# Patient Record
Sex: Female | Born: 1935 | Race: White | Hispanic: No | Smoking: Never smoker
Health system: Southern US, Community
[De-identification: ages and names within clinical notes are randomized; demographics above are authoritative.]

## PROBLEM LIST (undated history)

## (undated) DIAGNOSIS — M1990 Unspecified osteoarthritis, unspecified site: Secondary | ICD-10-CM

## (undated) DIAGNOSIS — M419 Scoliosis, unspecified: Secondary | ICD-10-CM

## (undated) DIAGNOSIS — D649 Anemia, unspecified: Secondary | ICD-10-CM

## (undated) DIAGNOSIS — J189 Pneumonia, unspecified organism: Secondary | ICD-10-CM

## (undated) DIAGNOSIS — H3530 Unspecified macular degeneration: Secondary | ICD-10-CM

## (undated) HISTORY — PX: CATARACT EXTRACTION W/ INTRAOCULAR LENS  IMPLANT, BILATERAL: SHX1307

## (undated) HISTORY — PX: EYE SURGERY: SHX253

---

## 2000-11-04 ENCOUNTER — Encounter: Payer: Self-pay | Admitting: Ophthalmology

## 2000-11-04 ENCOUNTER — Ambulatory Visit (HOSPITAL_COMMUNITY): Admission: RE | Admit: 2000-11-04 | Discharge: 2000-11-04 | Payer: Self-pay | Admitting: Ophthalmology

## 2004-07-05 ENCOUNTER — Ambulatory Visit (HOSPITAL_COMMUNITY): Admission: RE | Admit: 2004-07-05 | Discharge: 2004-07-06 | Payer: Self-pay | Admitting: Ophthalmology

## 2006-01-24 IMAGING — CR DG CHEST 2V
2 series · 2 of 2 positions shown · non-contrast
Comparison: There are no prior studies currently available for comparison.

CLINICAL DATA: Pre-retinal fibrosis.  Preoperative respiratory exam.
 CHEST ? 2 VIEW, 07/05/04:

[view not recorded (1 of 2)]
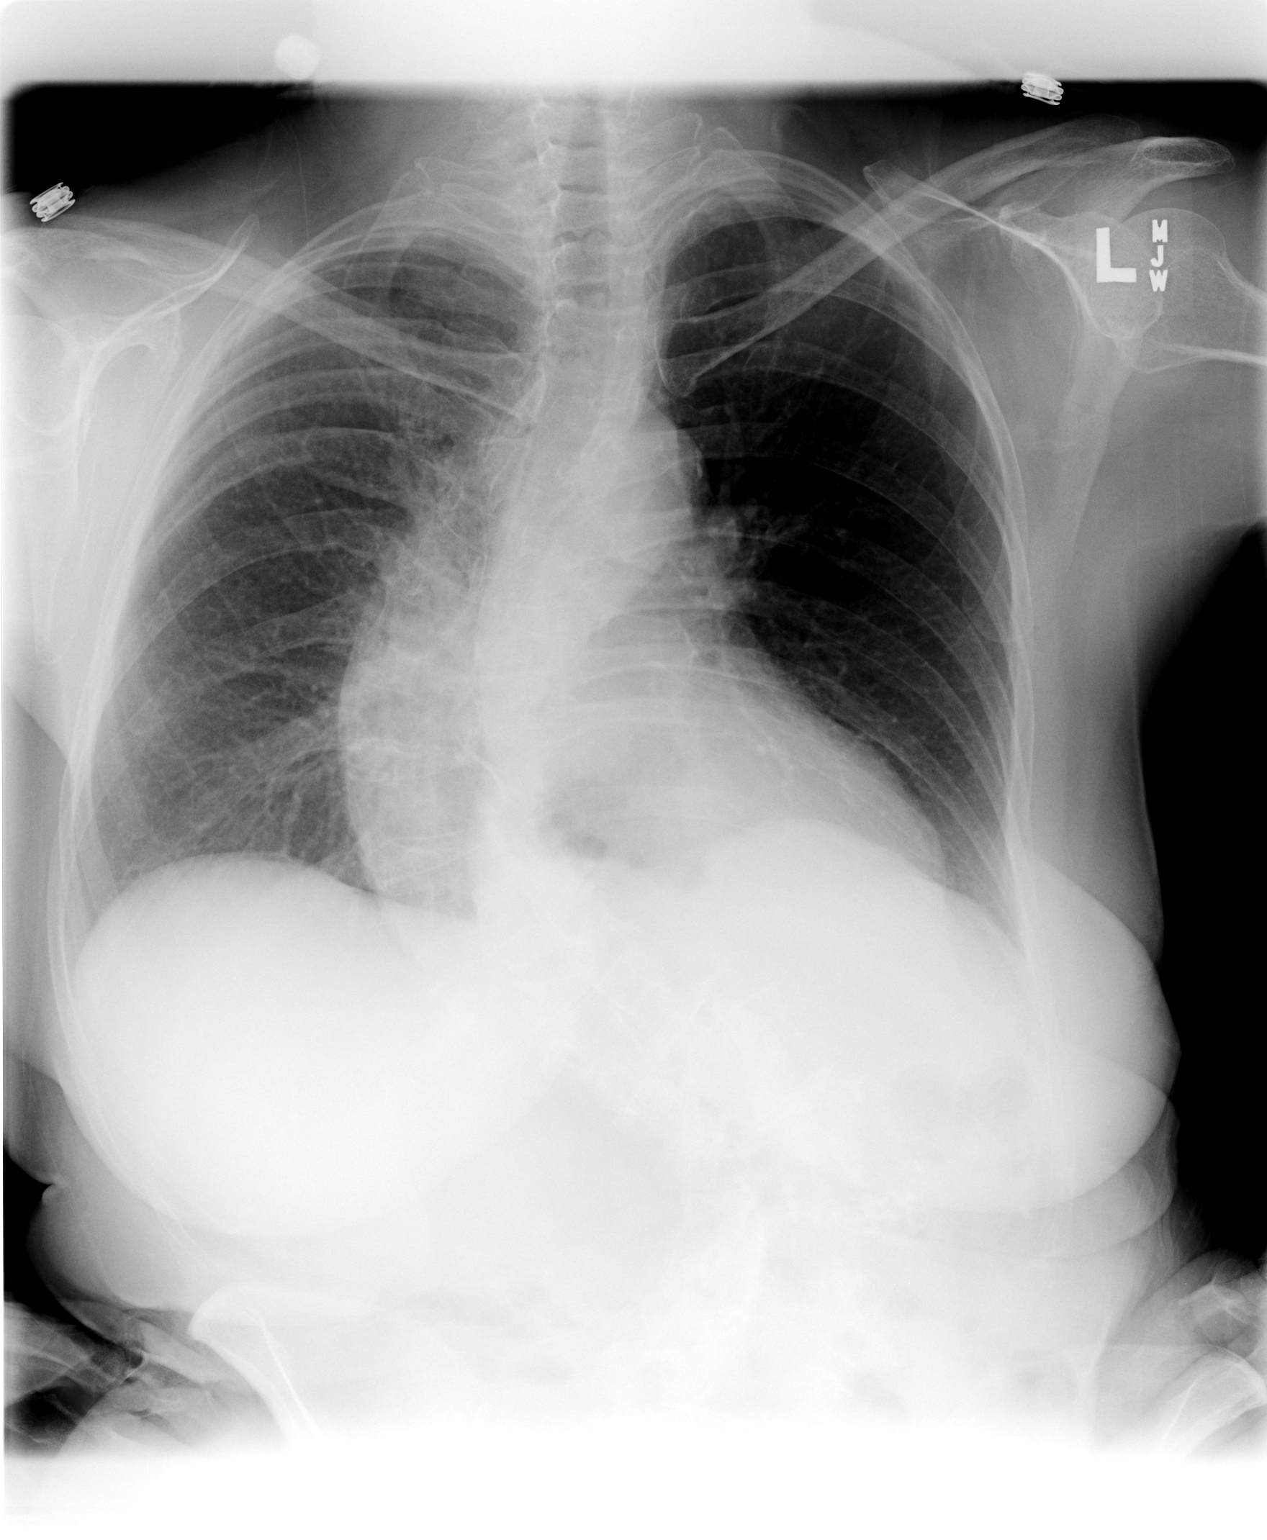

[view not recorded (2 of 2)]
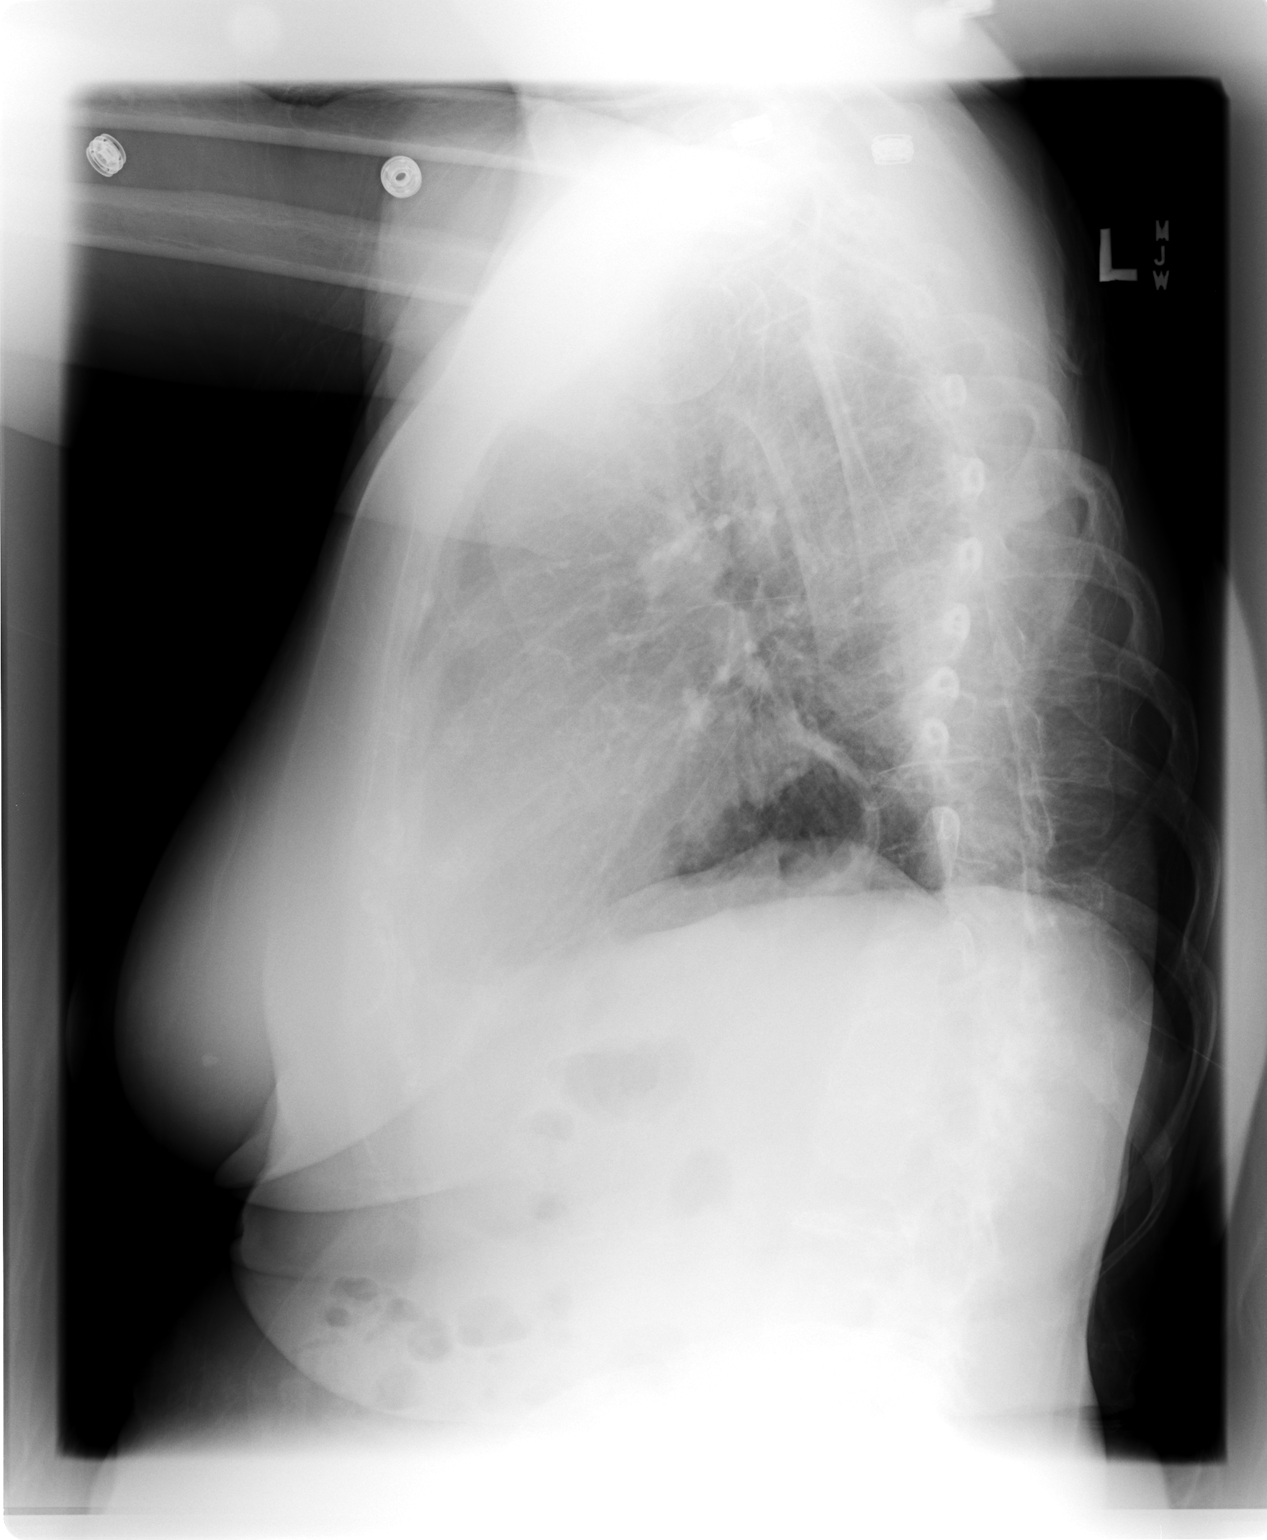

[2 of 2 positions shown; findings below may reference images not displayed]

There is marked thoracolumbar scoliosis present.  There is a moderate-sized hiatal hernia.  There are no infiltrative or edematous changes.  The heart is upper normal in size.
IMPRESSION: Marked scoliosis.  Hiatal hernia.  No evidence for active chest disease.

## 2011-01-07 ENCOUNTER — Ambulatory Visit (INDEPENDENT_AMBULATORY_CARE_PROVIDER_SITE_OTHER): Payer: Medicare Other | Admitting: Ophthalmology

## 2011-01-07 DIAGNOSIS — H3530 Unspecified macular degeneration: Secondary | ICD-10-CM

## 2011-01-07 DIAGNOSIS — H43819 Vitreous degeneration, unspecified eye: Secondary | ICD-10-CM

## 2011-01-07 DIAGNOSIS — H26499 Other secondary cataract, unspecified eye: Secondary | ICD-10-CM

## 2011-01-14 ENCOUNTER — Other Ambulatory Visit (INDEPENDENT_AMBULATORY_CARE_PROVIDER_SITE_OTHER): Payer: Medicare Other | Admitting: Ophthalmology

## 2011-01-14 DIAGNOSIS — H2700 Aphakia, unspecified eye: Secondary | ICD-10-CM

## 2011-07-17 ENCOUNTER — Ambulatory Visit (INDEPENDENT_AMBULATORY_CARE_PROVIDER_SITE_OTHER): Payer: Medicare Other | Admitting: Ophthalmology

## 2011-07-17 DIAGNOSIS — H43819 Vitreous degeneration, unspecified eye: Secondary | ICD-10-CM

## 2011-07-17 DIAGNOSIS — H35379 Puckering of macula, unspecified eye: Secondary | ICD-10-CM

## 2011-07-17 DIAGNOSIS — H3530 Unspecified macular degeneration: Secondary | ICD-10-CM

## 2012-01-20 ENCOUNTER — Ambulatory Visit (INDEPENDENT_AMBULATORY_CARE_PROVIDER_SITE_OTHER): Payer: Medicare Other | Admitting: Ophthalmology

## 2012-01-20 DIAGNOSIS — H43819 Vitreous degeneration, unspecified eye: Secondary | ICD-10-CM

## 2012-01-20 DIAGNOSIS — H35379 Puckering of macula, unspecified eye: Secondary | ICD-10-CM

## 2012-01-20 DIAGNOSIS — H3530 Unspecified macular degeneration: Secondary | ICD-10-CM

## 2012-07-22 ENCOUNTER — Ambulatory Visit (INDEPENDENT_AMBULATORY_CARE_PROVIDER_SITE_OTHER): Payer: Medicare Other | Admitting: Ophthalmology

## 2012-08-10 ENCOUNTER — Ambulatory Visit (INDEPENDENT_AMBULATORY_CARE_PROVIDER_SITE_OTHER): Payer: Medicare Other | Admitting: Ophthalmology

## 2012-08-10 DIAGNOSIS — H3530 Unspecified macular degeneration: Secondary | ICD-10-CM

## 2012-08-10 DIAGNOSIS — H43819 Vitreous degeneration, unspecified eye: Secondary | ICD-10-CM

## 2012-08-10 DIAGNOSIS — H35379 Puckering of macula, unspecified eye: Secondary | ICD-10-CM

## 2013-02-10 ENCOUNTER — Ambulatory Visit (INDEPENDENT_AMBULATORY_CARE_PROVIDER_SITE_OTHER): Payer: Medicare Other | Admitting: Ophthalmology

## 2013-02-10 DIAGNOSIS — H43819 Vitreous degeneration, unspecified eye: Secondary | ICD-10-CM

## 2013-02-10 DIAGNOSIS — H35379 Puckering of macula, unspecified eye: Secondary | ICD-10-CM

## 2013-02-10 DIAGNOSIS — H3530 Unspecified macular degeneration: Secondary | ICD-10-CM

## 2013-08-10 ENCOUNTER — Ambulatory Visit (INDEPENDENT_AMBULATORY_CARE_PROVIDER_SITE_OTHER): Payer: Medicare Other | Admitting: Ophthalmology

## 2013-08-10 DIAGNOSIS — H43819 Vitreous degeneration, unspecified eye: Secondary | ICD-10-CM

## 2013-08-10 DIAGNOSIS — H35379 Puckering of macula, unspecified eye: Secondary | ICD-10-CM

## 2013-08-10 DIAGNOSIS — H3530 Unspecified macular degeneration: Secondary | ICD-10-CM

## 2013-08-10 NOTE — H&P (Signed)
Emily Potter is an 80 y.o. female.   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  No past surgical history on file.  No family history on file. Social History:  has no tobacco, alcohol, and drug history on file.  Allergies: Allergies not on file  No prescriptions prior to admission    Review of systems otherwise negative  There were no vitals taken for this visit.  Physical exam: Mental status: oriented x3. Eyes: See eye exam associated with this date of surgery in media tab.  Scanned in by scanning center Ears, Nose, Throat: within normal limits Neck: Within Normal limits General: within normal limits Chest: Within normal limits Breast: deferred Heart: Within normal limits Abdomen: Within normal limits GU: deferred Extremities: within normal limits Skin: within normal limits  Assessment/Plan Preretinal fibrosis left eye Plan: To [facility] for Pars plana vitrectomy, membrane peel, gas injection, laser treatment left eye  Sherrie GeorgeMATTHEWS, JOHN D 08/10/2013, 3:37 PM

## 2013-08-23 ENCOUNTER — Encounter (HOSPITAL_COMMUNITY): Payer: Self-pay | Admitting: Emergency Medicine

## 2013-08-23 ENCOUNTER — Emergency Department (HOSPITAL_COMMUNITY)
Admission: EM | Admit: 2013-08-23 | Discharge: 2013-08-23 | Disposition: A | Discharge status: Home / Self Care | Payer: Medicare Other | Attending: Emergency Medicine | Admitting: Emergency Medicine

## 2013-08-23 ENCOUNTER — Emergency Department (HOSPITAL_COMMUNITY): Payer: Medicare Other

## 2013-08-23 DIAGNOSIS — J189 Pneumonia, unspecified organism: Secondary | ICD-10-CM

## 2013-08-23 DIAGNOSIS — J159 Unspecified bacterial pneumonia: Secondary | ICD-10-CM | POA: Insufficient documentation

## 2013-08-23 DIAGNOSIS — Z79899 Other long term (current) drug therapy: Secondary | ICD-10-CM | POA: Insufficient documentation

## 2013-08-23 DIAGNOSIS — R630 Anorexia: Secondary | ICD-10-CM | POA: Insufficient documentation

## 2013-08-23 DIAGNOSIS — D649 Anemia, unspecified: Secondary | ICD-10-CM | POA: Insufficient documentation

## 2013-08-23 LAB — CBC WITH DIFFERENTIAL/PLATELET
BASOS ABS: 0 10*3/uL (ref 0.0–0.1)
Basophils Relative: 0 % (ref 0–1)
EOS PCT: 0 % (ref 0–5)
Eosinophils Absolute: 0 10*3/uL (ref 0.0–0.7)
HCT: 30.2 % — ABNORMAL LOW (ref 36.0–46.0)
Hemoglobin: 9.9 g/dL — ABNORMAL LOW (ref 12.0–15.0)
Lymphocytes Relative: 9 % — ABNORMAL LOW (ref 12–46)
Lymphs Abs: 0.7 10*3/uL (ref 0.7–4.0)
MCH: 29.6 pg (ref 26.0–34.0)
MCHC: 32.8 g/dL (ref 30.0–36.0)
MCV: 90.1 fL (ref 78.0–100.0)
Monocytes Absolute: 0.7 10*3/uL (ref 0.1–1.0)
Monocytes Relative: 8 % (ref 3–12)
Neutro Abs: 6.7 10*3/uL (ref 1.7–7.7)
Neutrophils Relative %: 83 % — ABNORMAL HIGH (ref 43–77)
PLATELETS: 173 10*3/uL (ref 150–400)
RBC: 3.35 MIL/uL — ABNORMAL LOW (ref 3.87–5.11)
RDW: 13.8 % (ref 11.5–15.5)
WBC: 8.2 10*3/uL (ref 4.0–10.5)

## 2013-08-23 LAB — BASIC METABOLIC PANEL
BUN: 16 mg/dL (ref 6–23)
CO2: 29 meq/L (ref 19–32)
CREATININE: 0.73 mg/dL (ref 0.50–1.10)
Calcium: 8.4 mg/dL (ref 8.4–10.5)
Chloride: 97 mEq/L (ref 96–112)
GFR calc Af Amer: >90 mL/min (ref >90)
GFR calc non Af Amer: 78 mL/min — ABNORMAL LOW (ref >90)
Glucose, Bld: 104 mg/dL — ABNORMAL HIGH (ref 70–99)
Potassium: 3.9 mEq/L (ref 3.7–5.3)
Sodium: 137 mEq/L (ref 137–147)

## 2013-08-23 MED ORDER — DOXYCYCLINE HYCLATE 100 MG PO CAPS: 100.0000 mg | ORAL_CAPSULE | Freq: Two times a day (BID) | ORAL | Status: DC

## 2013-08-23 MED ORDER — AZITHROMYCIN 250 MG PO TABS
500.0000 mg | ORAL_TABLET | Freq: Once | ORAL | Status: AC
  Administered 2013-08-23: 500 mg via ORAL
  Filled 2013-08-23: qty 2

## 2013-08-23 MED ORDER — ALBUTEROL SULFATE (2.5 MG/3ML) 0.083% IN NEBU
2.5000 mg | INHALATION_SOLUTION | Freq: Once | RESPIRATORY_TRACT | Status: AC
Start: 2013-08-23 — End: 2013-08-23
  Administered 2013-08-23: 2.5 mg via RESPIRATORY_TRACT
  Filled 2013-08-23: qty 3

## 2013-08-23 MED ORDER — ALBUTEROL SULFATE HFA 108 (90 BASE) MCG/ACT IN AERS
2.0000 | INHALATION_SPRAY | Freq: Once | RESPIRATORY_TRACT | Status: AC
  Administered 2013-08-23: 2 via RESPIRATORY_TRACT
  Filled 2013-08-23: qty 6.7

## 2013-08-23 MED ORDER — IPRATROPIUM-ALBUTEROL 0.5-2.5 (3) MG/3ML IN SOLN
3.0000 mL | Freq: Once | RESPIRATORY_TRACT | Status: AC
  Administered 2013-08-23: 3 mL via RESPIRATORY_TRACT
  Filled 2013-08-23: qty 3

## 2013-08-23 MED ORDER — DEXTROSE 5 % IV SOLN
1.0000 g | Freq: Once | INTRAVENOUS | Status: AC
  Administered 2013-08-23: 1 g via INTRAVENOUS
  Filled 2013-08-23: qty 10

## 2013-08-23 NOTE — ED Notes (Signed)
o2 sats 89-92.  Placed on 2 liters oxygen via [place].

## 2013-08-23 NOTE — Discharge Instructions (Signed)

## 2013-08-23 NOTE — ED Notes (Signed)
Pt co sob and cough x1 week.

## 2013-08-23 NOTE — ED Notes (Signed)
Pt up ambulatory,  No sob with exertion.   Pulse ox when back to room 88-89 %.  sats quickly came up to 94%.  Pt no distress.

## 2013-08-23 NOTE — ED Provider Notes (Signed)
CSN: [id number]     Arrival date & time 08/23/13  1127 History   This chart was scribed for Joya Gaskins, MD by Manuela Schwartz, ED scribe. This patient was seen in room APA14/APA14 and the patient's care was started at 1127.  Chief Complaint  Patient presents with  . Cough   The history is provided by the patient. No language interpreter was used.   HPI Comments: Emily Potter is a 80 y.o. female who presents to the Emergency Department complaining of a a constant, gradually worsened non productive cough ongoing for x6 days. She reports associated decreased appetite although drinking fluids well. She denies associated sore throat, emesis, fever. She has not yet seen anyone for this problem. She denies hx of lung problems. She has a recent fall in the past week for generalized weakness but no injury from her fall.   She is not a smoker She has no PCP  PMH - none History reviewed. No pertinent past surgical history. History reviewed. No pertinent family history. History  Substance Use Topics  . Smoking status: Never Smoker   . Smokeless tobacco: Not on file  . Alcohol Use: No   OB History   Grav Para Term Preterm Abortions TAB SAB Ect Mult Living                 Review of Systems  Constitutional: Positive for appetite change. Negative for fever and chills.  Respiratory: Positive for cough. Negative for shortness of breath.        Denies hemoptysis   Cardiovascular: Negative for chest pain.  Gastrointestinal: Negative for nausea, vomiting, abdominal pain and blood in stool.  Musculoskeletal: Negative for back pain.  All other systems reviewed and are negative.    Allergies  Review of patient's allergies indicates no known allergies.  Home Medications   Current Outpatient Rx  Name  Route  Sig  Dispense  Refill  . Bromfenac Sodium (BROMDAY) 0.09 % SOLN   Right Eye   Place 1 drop into the right eye at bedtime.         . Multiple Vitamins-Minerals (PRESERVISION AREDS)  TABS   Oral   Take 1 tablet by mouth 2 (two) times daily.         Marland Kitchen doxycycline (VIBRAMYCIN) 100 MG capsule   Oral   Take 1 capsule (100 mg total) by mouth 2 (two) times daily.   20 capsule   0     Triage Vitals: BP 130/68  Pulse 69  Temp(Src) 98.4 F (36.9 C) (Oral)  Resp 20  Ht 4' 10.5" (1.486 m)  Wt 119 lb (53.978 kg)  BMI 24.44 kg/m2  SpO2 97%  Physical Exam CONSTITUTIONAL: Well developed/well nourished HEAD: Normocephalic/atraumatic EYES: EOMI/PERRL ENMT: Mucous membranes moist NECK: supple no meningeal signs SPINE:entire spine nontender CV: S1/S2 noted, no murmurs/rubs/gallops noted LUNGS:  no apparent distress. Coarse wheezing bilaterally ABDOMEN: soft, nontender, no rebound or guarding GU:no cva tenderness NEURO: Pt is awake/alert, moves all extremitiesx4 EXTREMITIES: pulses normal, full ROM. No lower extremity edema  SKIN: warm, color normal PSYCH: no abnormalities of mood noted  ED Course  Procedures  DIAGNOSTIC STUDIES: Oxygen Saturation is 97% on room air, normal by my interpretation.    COORDINATION OF CARE: At 1210 PM Discussed treatment plan with patient which includes CXR. Patient agrees.  3:30 PM Pt feels much improved She is ambulatory and denies symptoms Her lung sounds have improved though with some residual wheeze Her pulse ox is  inconsistent (had nail polish on at this time) but greater >95% on repeat exam on RA She had no dyspnea on exertion Told her about anemia - she denies any recent GI bleed symptoms She prefers to go home Discussed strict return precautions and PCP f/u with one week and need for check on anemia Pt agreeable with plan  Labs Review Labs Reviewed  BASIC METABOLIC PANEL - Abnormal; Notable for the following:    Glucose, Bld 104 (*)    GFR calc non Af Amer 78 (*)    All other components within normal limits  CBC WITH DIFFERENTIAL - Abnormal; Notable for the following:    RBC 3.35 (*)    Hemoglobin 9.9 (*)     HCT 30.2 (*)    Neutrophils Relative % 83 (*)    Lymphocytes Relative 9 (*)    All other components within normal limits   Imaging Review Dg Chest 2 View  08/23/2013   CLINICAL DATA:  Nonproductive cough.  Fever.  EXAM: CHEST  2 VIEW  COMPARISON:  Chest x-ray 07/05/2004.  FINDINGS: Lung volumes are low and there are bibasilar opacities which may reflect areas of atelectasis and/or consolidation. Probable trace bilateral pleural effusions. No evidence of pulmonary edema. Heart size appears upper limits of normal. Mediastinal contours are grossly distorted by patient positioning. Severe S-shaped thoracolumbar scoliosis, convex to the right in the lower thoracic region and to the left in the lumbar region. Atherosclerosis in the thoracic aorta.  IMPRESSION: 1. Low lung volumes with bibasilar areas of atelectasis and/or consolidation and small bilateral pleural effusions. 2. Atherosclerosis.   Electronically Signed   By: Trudie Reedaniel  Entrikin M.D.   On: 08/23/2013 13:15     Date: 08/23/2013  Rate: 68  Rhythm: normal sinus rhythm  QRS Axis: normal  Intervals: normal  ST/T Wave abnormalities: nonspecific ST changes  Conduction Disutrbances:none  Narrative Interpretation:   Old EKG Reviewed: none available at time of interpretation    MDM   Final diagnoses:  CAP (community acquired pneumonia)  Anemia    Nursing notes including past medical history and social history reviewed and considered in documentation xrays reviewed and considered Labs/vital reviewed and considered  I personally performed the services described in this documentation, which was scribed in my presence. The recorded information has been reviewed and is accurate.      Joya Gaskinsonald W Deryl Ports, MD 08/23/13 [id number]

## 2013-09-07 ENCOUNTER — Encounter (HOSPITAL_COMMUNITY): Admission: RE | Payer: Self-pay | Source: Ambulatory Visit

## 2013-09-07 ENCOUNTER — Ambulatory Visit (HOSPITAL_COMMUNITY): Admission: RE | Admit: 2013-09-07 | Payer: Medicare Other | Source: Ambulatory Visit | Admitting: Ophthalmology

## 2013-09-07 SURGERY — 25 GAUGE PARS PLANA VITRECTOMY WITH 20 GAUGE MVR PORT
Anesthesia: General | Laterality: Left

## 2013-09-20 ENCOUNTER — Inpatient Hospital Stay (INDEPENDENT_AMBULATORY_CARE_PROVIDER_SITE_OTHER): Payer: Medicare Other | Admitting: Ophthalmology

## 2014-02-21 ENCOUNTER — Ambulatory Visit (INDEPENDENT_AMBULATORY_CARE_PROVIDER_SITE_OTHER): Payer: Medicare Other | Admitting: Ophthalmology

## 2014-02-21 DIAGNOSIS — H35372 Puckering of macula, left eye: Secondary | ICD-10-CM | POA: Diagnosis present

## 2014-02-21 DIAGNOSIS — H35379 Puckering of macula, unspecified eye: Secondary | ICD-10-CM

## 2014-02-21 DIAGNOSIS — H3530 Unspecified macular degeneration: Secondary | ICD-10-CM

## 2014-02-21 DIAGNOSIS — H43819 Vitreous degeneration, unspecified eye: Secondary | ICD-10-CM

## 2014-02-21 NOTE — H&P (Signed)
Emily Potter is an 80 y.o. female.   Chief Complaint:progressive loss of vision left eye HPI: Had pars plana vitrectomy right eye for preretinal fibrosis now needs same for left eye  No past medical history on file.  No past surgical history on file.  No family history on file. Social History:  reports that she has never smoked. She does not have any smokeless tobacco history on file. She reports that she does not drink alcohol. Her drug history is not on file.  Allergies: No Known Allergies  No prescriptions prior to admission    Review of systems otherwise negative  There were no vitals taken for this visit.  Physical exam: Mental status: oriented x3. Eyes: See eye exam associated with this date of surgery in media tab.  Scanned in by scanning center Ears, Nose, Throat: within normal limits Neck: Within Normal limits General: within normal limits Chest: Within normal limits Breast: deferred Heart: Within normal limits Abdomen: Within normal limits GU: deferred Extremities: within normal limits Skin: within normal limits  Assessment/Plan Preretinal fibrosis left eye Plan: To [facility] for Pars plana vitrectomy, laser, membrane peel, gas injection left eye  Emily Potter 02/21/2014, 5:04 PM

## 2014-03-09 ENCOUNTER — Encounter (HOSPITAL_COMMUNITY): Payer: Self-pay | Admitting: Pharmacy Technician

## 2014-03-11 ENCOUNTER — Encounter (HOSPITAL_COMMUNITY): Payer: Self-pay | Admitting: *Deleted

## 2014-03-14 MED ORDER — CEFAZOLIN SODIUM-DEXTROSE 2-3 GM-% IV SOLR
2.0000 g | INTRAVENOUS | Status: AC
  Administered 2014-03-15: 2 g via INTRAVENOUS
  Filled 2014-03-14: qty 50

## 2014-03-15 ENCOUNTER — Ambulatory Visit (HOSPITAL_COMMUNITY)
Admission: RE | Admit: 2014-03-15 | Discharge: 2014-03-16 | Disposition: A | Discharge status: Home / Self Care | Payer: Medicare Other | Source: Ambulatory Visit | Attending: Ophthalmology | Admitting: Ophthalmology

## 2014-03-15 ENCOUNTER — Encounter (HOSPITAL_COMMUNITY)
Admission: RE | Disposition: A | Discharge status: Home / Self Care | Payer: Self-pay | Source: Ambulatory Visit | Attending: Ophthalmology

## 2014-03-15 ENCOUNTER — Encounter (HOSPITAL_COMMUNITY): Payer: Medicare Other | Admitting: Certified Registered"

## 2014-03-15 ENCOUNTER — Ambulatory Visit (HOSPITAL_COMMUNITY): Payer: Medicare Other

## 2014-03-15 ENCOUNTER — Ambulatory Visit (HOSPITAL_COMMUNITY): Payer: Medicare Other | Admitting: Certified Registered"

## 2014-03-15 ENCOUNTER — Encounter (HOSPITAL_COMMUNITY): Payer: Self-pay | Admitting: *Deleted

## 2014-03-15 DIAGNOSIS — H43392 Other vitreous opacities, left eye: Secondary | ICD-10-CM | POA: Diagnosis not present

## 2014-03-15 DIAGNOSIS — H3531 Nonexudative age-related macular degeneration: Secondary | ICD-10-CM | POA: Diagnosis not present

## 2014-03-15 DIAGNOSIS — H3589 Other specified retinal disorders: Secondary | ICD-10-CM | POA: Diagnosis not present

## 2014-03-15 DIAGNOSIS — H35372 Puckering of macula, left eye: Secondary | ICD-10-CM | POA: Diagnosis present

## 2014-03-15 HISTORY — DX: Unspecified macular degeneration: H35.30

## 2014-03-15 HISTORY — DX: Scoliosis, unspecified: M41.9

## 2014-03-15 HISTORY — DX: Anemia, unspecified: D64.9

## 2014-03-15 HISTORY — DX: Pneumonia, unspecified organism: J18.9

## 2014-03-15 HISTORY — DX: Unspecified osteoarthritis, unspecified site: M19.90

## 2014-03-15 HISTORY — PX: PARS PLANA VITRECTOMY: SHX2166

## 2014-03-15 HISTORY — PX: LASER PHOTO ABLATION: SHX5942

## 2014-03-15 HISTORY — PX: MEMBRANE PEEL: SHX5967

## 2014-03-15 LAB — CBC
HCT: 36.8 % (ref 36.0–46.0)
Hemoglobin: 12.2 g/dL (ref 12.0–15.0)
MCH: 29.9 pg (ref 26.0–34.0)
MCHC: 33.2 g/dL (ref 30.0–36.0)
MCV: 90.2 fL (ref 78.0–100.0)
Platelets: 228 10*3/uL (ref 150–400)
RBC: 4.08 MIL/uL (ref 3.87–5.11)
RDW: 14.5 % (ref 11.5–15.5)
WBC: 8.8 10*3/uL (ref 4.0–10.5)

## 2014-03-15 SURGERY — PARS PLANA VITRECTOMY WITH 25 GAUGE
Anesthesia: General | Site: Eye | Laterality: Left

## 2014-03-15 MED ORDER — EPINEPHRINE HCL 1 MG/ML IJ SOLN
INTRAMUSCULAR | Status: AC
  Filled 2014-03-15: qty 1

## 2014-03-15 MED ORDER — PREDNISOLONE ACETATE 1 % OP SUSP
1.0000 [drp] | Freq: Four times a day (QID) | OPHTHALMIC | Status: DC
  Filled 2014-03-15: qty 1

## 2014-03-15 MED ORDER — BUPIVACAINE HCL (PF) 0.75 % IJ SOLN
INTRAMUSCULAR | Status: AC
  Filled 2014-03-15: qty 10

## 2014-03-15 MED ORDER — PROPOFOL 10 MG/ML IV BOLUS
INTRAVENOUS | Status: AC
  Filled 2014-03-15: qty 20

## 2014-03-15 MED ORDER — ROCURONIUM BROMIDE 50 MG/5ML IV SOLN
INTRAVENOUS | Status: AC
  Filled 2014-03-15: qty 2

## 2014-03-15 MED ORDER — BRIMONIDINE TARTRATE 0.2 % OP SOLN
1.0000 [drp] | Freq: Two times a day (BID) | OPHTHALMIC | Status: DC
  Filled 2014-03-15: qty 5

## 2014-03-15 MED ORDER — BACITRACIN-POLYMYXIN B 500-10000 UNIT/GM OP OINT
TOPICAL_OINTMENT | OPHTHALMIC | Status: DC | PRN
  Administered 2014-03-15: 1 via OPHTHALMIC

## 2014-03-15 MED ORDER — DEXAMETHASONE SODIUM PHOSPHATE 10 MG/ML IJ SOLN
INTRAMUSCULAR | Status: AC
  Filled 2014-03-15: qty 1

## 2014-03-15 MED ORDER — HYALURONIDASE HUMAN 150 UNIT/ML IJ SOLN
INTRAMUSCULAR | Status: AC
  Filled 2014-03-15: qty 1

## 2014-03-15 MED ORDER — GENTAMICIN SULFATE 40 MG/ML IJ SOLN
INTRAMUSCULAR | Status: AC
  Filled 2014-03-15: qty 2

## 2014-03-15 MED ORDER — SUCCINYLCHOLINE CHLORIDE 20 MG/ML IJ SOLN
INTRAMUSCULAR | Status: AC
  Filled 2014-03-15: qty 1

## 2014-03-15 MED ORDER — PHENYLEPHRINE HCL 2.5 % OP SOLN
1.0000 [drp] | OPHTHALMIC | Status: AC | PRN
  Administered 2014-03-15 (×3): 1 [drp] via OPHTHALMIC
  Filled 2014-03-15: qty 2

## 2014-03-15 MED ORDER — GATIFLOXACIN 0.5 % OP SOLN: 1.0000 [drp] | OPHTHALMIC | Status: DC | PRN

## 2014-03-15 MED ORDER — SODIUM CHLORIDE 0.9 % IV SOLN
INTRAVENOUS | Status: DC
  Administered 2014-03-15: 10:00:00 via INTRAVENOUS

## 2014-03-15 MED ORDER — ATROPINE SULFATE 1 % OP SOLN
OPHTHALMIC | Status: AC
  Filled 2014-03-15: qty 2

## 2014-03-15 MED ORDER — EPHEDRINE SULFATE 50 MG/ML IJ SOLN
INTRAMUSCULAR | Status: DC | PRN
  Administered 2014-03-15: 10 mg via INTRAVENOUS

## 2014-03-15 MED ORDER — FENTANYL CITRATE 0.05 MG/ML IJ SOLN
INTRAMUSCULAR | Status: AC
  Filled 2014-03-15: qty 5

## 2014-03-15 MED ORDER — PHENYLEPHRINE HCL 2.5 % OP SOLN: 1.0000 [drp] | OPHTHALMIC | Status: DC | PRN

## 2014-03-15 MED ORDER — ROCURONIUM BROMIDE 100 MG/10ML IV SOLN
INTRAVENOUS | Status: DC | PRN
  Administered 2014-03-15: 40 mg via INTRAVENOUS

## 2014-03-15 MED ORDER — EPINEPHRINE HCL 1 MG/ML IJ SOLN
INTRAOCULAR | Status: DC | PRN
  Administered 2014-03-15: 12:00:00

## 2014-03-15 MED ORDER — PHENYLEPHRINE 40 MCG/ML (10ML) SYRINGE FOR IV PUSH (FOR BLOOD PRESSURE SUPPORT)
PREFILLED_SYRINGE | INTRAVENOUS | Status: AC
  Filled 2014-03-15: qty 10

## 2014-03-15 MED ORDER — SODIUM CHLORIDE 0.45 % IV SOLN
INTRAVENOUS | Status: DC
  Administered 2014-03-15: 20 mL/h via INTRAVENOUS

## 2014-03-15 MED ORDER — SODIUM HYALURONATE 10 MG/ML IO SOLN
INTRAOCULAR | Status: DC | PRN
  Administered 2014-03-15: 0.85 mL via INTRAOCULAR

## 2014-03-15 MED ORDER — FENTANYL CITRATE 0.05 MG/ML IJ SOLN
INTRAMUSCULAR | Status: DC | PRN
  Administered 2014-03-15: 50 ug via INTRAVENOUS

## 2014-03-15 MED ORDER — BSS PLUS IO SOLN
INTRAOCULAR | Status: AC
  Filled 2014-03-15: qty 500

## 2014-03-15 MED ORDER — SODIUM CHLORIDE 0.9 % IV SOLN
INTRAVENOUS | Status: DC | PRN
  Administered 2014-03-15: 11:00:00 via INTRAVENOUS

## 2014-03-15 MED ORDER — BACITRACIN-POLYMYXIN B 500-10000 UNIT/GM OP OINT
TOPICAL_OINTMENT | OPHTHALMIC | Status: AC
  Filled 2014-03-15: qty 3.5

## 2014-03-15 MED ORDER — BUPIVACAINE HCL (PF) 0.75 % IJ SOLN
INTRAMUSCULAR | Status: DC | PRN
  Administered 2014-03-15: 10 mL

## 2014-03-15 MED ORDER — GLYCOPYRROLATE 0.2 MG/ML IJ SOLN
INTRAMUSCULAR | Status: AC
  Filled 2014-03-15: qty 1

## 2014-03-15 MED ORDER — ONDANSETRON HCL 4 MG/2ML IJ SOLN
INTRAMUSCULAR | Status: DC | PRN
  Administered 2014-03-15: 4 mg via INTRAVENOUS

## 2014-03-15 MED ORDER — GLYCOPYRROLATE 0.2 MG/ML IJ SOLN
INTRAMUSCULAR | Status: DC | PRN
  Administered 2014-03-15: 0.6 mg via INTRAVENOUS

## 2014-03-15 MED ORDER — ONDANSETRON HCL 4 MG/2ML IJ SOLN
INTRAMUSCULAR | Status: AC
  Filled 2014-03-15: qty 2

## 2014-03-15 MED ORDER — GATIFLOXACIN 0.5 % OP SOLN
1.0000 [drp] | Freq: Four times a day (QID) | OPHTHALMIC | Status: DC
  Filled 2014-03-15: qty 2.5

## 2014-03-15 MED ORDER — SODIUM HYALURONATE 10 MG/ML IO SOLN
INTRAOCULAR | Status: AC
  Filled 2014-03-15: qty 0.85

## 2014-03-15 MED ORDER — BACITRACIN-POLYMYXIN B 500-10000 UNIT/GM OP OINT
1.0000 "application " | TOPICAL_OINTMENT | Freq: Four times a day (QID) | OPHTHALMIC | Status: DC
  Filled 2014-03-15: qty 3.5

## 2014-03-15 MED ORDER — GATIFLOXACIN 0.5 % OP SOLN
1.0000 [drp] | OPHTHALMIC | Status: AC | PRN
  Administered 2014-03-15 (×3): 1 [drp] via OPHTHALMIC
  Filled 2014-03-15: qty 2.5

## 2014-03-15 MED ORDER — NEOSTIGMINE METHYLSULFATE 10 MG/10ML IV SOLN
INTRAVENOUS | Status: DC | PRN
  Administered 2014-03-15: 4 mg via INTRAVENOUS

## 2014-03-15 MED ORDER — TEMAZEPAM 15 MG PO CAPS: 15.0000 mg | ORAL_CAPSULE | Freq: Every evening | ORAL | Status: DC | PRN

## 2014-03-15 MED ORDER — CYCLOPENTOLATE HCL 1 % OP SOLN: 1.0000 [drp] | OPHTHALMIC | Status: DC | PRN

## 2014-03-15 MED ORDER — LATANOPROST 0.005 % OP SOLN
1.0000 [drp] | Freq: Every day | OPHTHALMIC | Status: DC
  Filled 2014-03-15: qty 2.5

## 2014-03-15 MED ORDER — SODIUM CHLORIDE 0.9 % IJ SOLN
INTRAMUSCULAR | Status: AC
  Filled 2014-03-15: qty 10

## 2014-03-15 MED ORDER — SODIUM CHLORIDE 0.9 % IJ SOLN
INTRAMUSCULAR | Status: DC | PRN
  Administered 2014-03-15: 12:00:00

## 2014-03-15 MED ORDER — LIDOCAINE HCL (CARDIAC) 20 MG/ML IV SOLN
INTRAVENOUS | Status: DC | PRN
Start: 2014-03-15 — End: 2014-03-15
  Administered 2014-03-15: 60 mg via INTRAVENOUS

## 2014-03-15 MED ORDER — OXYCODONE HCL 5 MG PO TABS: 5.0000 mg | ORAL_TABLET | Freq: Once | ORAL | Status: DC | PRN

## 2014-03-15 MED ORDER — OXYCODONE HCL 5 MG/5ML PO SOLN: 5.0000 mg | Freq: Once | ORAL | Status: DC | PRN

## 2014-03-15 MED ORDER — CYCLOPENTOLATE HCL 1 % OP SOLN
1.0000 [drp] | OPHTHALMIC | Status: AC | PRN
  Administered 2014-03-15 (×3): 1 [drp] via OPHTHALMIC
  Filled 2014-03-15: qty 2

## 2014-03-15 MED ORDER — LIDOCAINE HCL (CARDIAC) 20 MG/ML IV SOLN
INTRAVENOUS | Status: AC
  Filled 2014-03-15: qty 5

## 2014-03-15 MED ORDER — TROPICAMIDE 1 % OP SOLN: 1.0000 [drp] | OPHTHALMIC | Status: DC | PRN

## 2014-03-15 MED ORDER — 0.9 % SODIUM CHLORIDE (POUR BTL) OPTIME
TOPICAL | Status: DC | PRN
  Administered 2014-03-15: 1000 mL

## 2014-03-15 MED ORDER — ACETAMINOPHEN 325 MG PO TABS: 325.0000 mg | ORAL_TABLET | ORAL | Status: DC | PRN

## 2014-03-15 MED ORDER — EPHEDRINE SULFATE 50 MG/ML IJ SOLN
INTRAMUSCULAR | Status: AC
  Filled 2014-03-15: qty 1

## 2014-03-15 MED ORDER — MAGNESIUM HYDROXIDE 400 MG/5ML PO SUSP: 15.0000 mL | Freq: Four times a day (QID) | ORAL | Status: DC | PRN

## 2014-03-15 MED ORDER — TROPICAMIDE 1 % OP SOLN
1.0000 [drp] | OPHTHALMIC | Status: AC | PRN
  Administered 2014-03-15 (×3): 1 [drp] via OPHTHALMIC
  Filled 2014-03-15: qty 3

## 2014-03-15 MED ORDER — HYDROCODONE-ACETAMINOPHEN 5-325 MG PO TABS: 1.0000 | ORAL_TABLET | ORAL | Status: DC | PRN

## 2014-03-15 MED ORDER — TETRACAINE HCL 0.5 % OP SOLN
2.0000 [drp] | Freq: Once | OPHTHALMIC | Status: DC
  Filled 2014-03-15: qty 2

## 2014-03-15 MED ORDER — ONDANSETRON HCL 4 MG/2ML IJ SOLN: 4.0000 mg | Freq: Four times a day (QID) | INTRAMUSCULAR | Status: DC | PRN

## 2014-03-15 MED ORDER — NEOSTIGMINE METHYLSULFATE 10 MG/10ML IV SOLN
INTRAVENOUS | Status: AC
  Filled 2014-03-15: qty 1

## 2014-03-15 MED ORDER — FENTANYL CITRATE 0.05 MG/ML IJ SOLN: 25.0000 ug | INTRAMUSCULAR | Status: DC | PRN

## 2014-03-15 MED ORDER — MORPHINE SULFATE 2 MG/ML IJ SOLN: 1.0000 mg | INTRAMUSCULAR | Status: DC | PRN

## 2014-03-15 MED ORDER — POLYMYXIN B SULFATE 500000 UNITS IJ SOLR
INTRAMUSCULAR | Status: AC
  Filled 2014-03-15: qty 1

## 2014-03-15 MED ORDER — ACETAZOLAMIDE SODIUM 500 MG IJ SOLR
500.0000 mg | Freq: Once | INTRAMUSCULAR | Status: AC
  Administered 2014-03-16: 500 mg via INTRAVENOUS
  Filled 2014-03-15: qty 500

## 2014-03-15 MED ORDER — DOCUSATE SODIUM 100 MG PO CAPS
100.0000 mg | ORAL_CAPSULE | Freq: Two times a day (BID) | ORAL | Status: DC
  Administered 2014-03-15: 100 mg via ORAL
  Filled 2014-03-15: qty 1

## 2014-03-15 MED ORDER — STERILE WATER FOR INJECTION IJ SOLN
INTRAMUSCULAR | Status: AC
Start: 2014-03-15 — End: 2014-03-15
  Filled 2014-03-15: qty 10

## 2014-03-15 MED ORDER — PROPOFOL 10 MG/ML IV BOLUS
INTRAVENOUS | Status: DC | PRN
  Administered 2014-03-15: 150 mg via INTRAVENOUS

## 2014-03-15 MED ORDER — DEXAMETHASONE SODIUM PHOSPHATE 10 MG/ML IJ SOLN
INTRAMUSCULAR | Status: DC | PRN
  Administered 2014-03-15: 10 mg

## 2014-03-15 SURGICAL SUPPLY — 42 items
CANNULA VLV SOFT TIP 25GA (OPHTHALMIC) ×2 IMPLANT
COTTONBALL LRG STERILE PKG (GAUZE/BANDAGES/DRESSINGS) ×6 IMPLANT
COVER MAYO STAND STRL (DRAPES) ×2 IMPLANT
DRAPE INCISE 51X51 W/FILM STRL (DRAPES) ×2 IMPLANT
DRAPE OPHTHALMIC 77X100 STRL (CUSTOM PROCEDURE TRAY) ×2 IMPLANT
FORCEPS ECKARDT ILM 25G SERR (OPHTHALMIC RELATED) IMPLANT
FORCEPS GRIESHABER ILM 25G A (INSTRUMENTS) ×2 IMPLANT
GLOVE SS BIOGEL STRL SZ 6.5 (GLOVE) ×1 IMPLANT
GLOVE SS BIOGEL STRL SZ 7 (GLOVE) ×1 IMPLANT
GLOVE SUPERSENSE BIOGEL SZ 6.5 (GLOVE) ×1
GLOVE SUPERSENSE BIOGEL SZ 7 (GLOVE) ×1
GLOVE SURG 8.5 LATEX PF (GLOVE) ×2 IMPLANT
GLOVE SURG SS PI 7.0 STRL IVOR (GLOVE) ×2 IMPLANT
GOWN STRL REUS W/ TWL LRG LVL3 (GOWN DISPOSABLE) ×3 IMPLANT
GOWN STRL REUS W/TWL LRG LVL3 (GOWN DISPOSABLE) ×3
HANDLE PNEUMATIC FOR CONSTEL (OPHTHALMIC) ×2 IMPLANT
KIT BASIN OR (CUSTOM PROCEDURE TRAY) ×2 IMPLANT
MICROPICK 25G (MISCELLANEOUS) ×2
NEEDLE 18GX1X1/2 (RX/OR ONLY) (NEEDLE) ×2 IMPLANT
NEEDLE 25GX 5/8IN NON SAFETY (NEEDLE) ×2 IMPLANT
NEEDLE FILTER BLUNT 18X 1/2SAF (NEEDLE) ×1
NEEDLE FILTER BLUNT 18X1 1/2 (NEEDLE) ×1 IMPLANT
NEEDLE HYPO 30X.5 LL (NEEDLE) ×2 IMPLANT
NS IRRIG 1000ML POUR BTL (IV SOLUTION) ×2 IMPLANT
PACK VITRECTOMY CUSTOM (CUSTOM PROCEDURE TRAY) ×2 IMPLANT
PAD ARMBOARD 7.5X6 YLW CONV (MISCELLANEOUS) ×4 IMPLANT
PAK PIK VITRECTOMY CVS 25GA (OPHTHALMIC) ×2 IMPLANT
PENCIL BIPOLAR 25GA STR DISP (OPHTHALMIC RELATED) IMPLANT
PIC ILLUMINATED 25G (OPHTHALMIC) ×2
PICK MICROPICK 25G (MISCELLANEOUS) ×1 IMPLANT
PIK ILLUMINATED 25G (OPHTHALMIC) ×1 IMPLANT
PROBE LASER ILLUM FLEX CVD 25G (OPHTHALMIC) ×2 IMPLANT
ROLLS DENTAL (MISCELLANEOUS) ×4 IMPLANT
SCISSORS TIP ADVANCED DSP 25GA (INSTRUMENTS) IMPLANT
SCRAPER DIAMOND 25GA (OPHTHALMIC RELATED) ×2 IMPLANT
SPONGE SURGIFOAM ABS GEL 12-7 (HEMOSTASIS) ×2 IMPLANT
SYR 20CC LL (SYRINGE) ×2 IMPLANT
SYR BULB 3OZ (MISCELLANEOUS) ×2 IMPLANT
SYR TB 1ML LUER SLIP (SYRINGE) ×2 IMPLANT
TOWEL OR 17X24 6PK STRL BLUE (TOWEL DISPOSABLE) ×6 IMPLANT
WATER STERILE IRR 1000ML POUR (IV SOLUTION) ×2 IMPLANT
WIPE INSTRUMENT VISIWIPE 73X73 (MISCELLANEOUS) ×2 IMPLANT

## 2014-03-15 NOTE — Transfer of Care (Signed)
Immediate Anesthesia Transfer of Care Note  Patient: Emily Potter  Procedure(s) Performed: Procedure(s): PARS PLANA VITRECTOMY WITH 25 GAUGE (Left) LASER PHOTO ABLATION (Left) AIR/FLUID EXCHANGE (Left) MEMBRANE PEEL (Left)  Patient Location: PACU  Anesthesia Type:General  Level of Consciousness: sedated  Airway & Oxygen Therapy: Patient Spontanous Breathing and Patient connected to face mask oxygen  Post-op Assessment: Report given to PACU RN and Post -op Vital signs reviewed and stable  Post vital signs: Reviewed and stable  Complications: No apparent anesthesia complications

## 2014-03-15 NOTE — Anesthesia Postprocedure Evaluation (Signed)
  Anesthesia Post-op Note  Patient: Emily Potter  Procedure(s) Performed: Procedure(s): PARS PLANA VITRECTOMY WITH 25 GAUGE (Left) LASER PHOTO ABLATION (Left) AIR/FLUID EXCHANGE (Left) MEMBRANE PEEL (Left)  Patient Location: PACU  Anesthesia Type:General  Level of Consciousness: awake  Airway and Oxygen Therapy: Patient Spontanous Breathing  Post-op Pain: mild  Post-op Assessment: Post-op Vital signs reviewed  Post-op Vital Signs: Reviewed  Last Vitals:  Filed Vitals:   03/15/14 1411  BP: 137/73  Pulse: 59  Temp: 36.6 C  Resp: 14    Complications: No apparent anesthesia complications

## 2014-03-15 NOTE — Anesthesia Procedure Notes (Signed)
Procedure Name: Intubation Date/Time: 03/15/2014 11:32 AM Performed by: Brien MatesMAHONY, Jaymari Cromie D Pre-anesthesia Checklist: Patient identified, Emergency Drugs available, Suction available, Patient being monitored and Timeout performed Patient Re-evaluated:Patient Re-evaluated prior to inductionOxygen Delivery Method: Circle system utilized Preoxygenation: Pre-oxygenation with 100% oxygen Intubation Type: IV induction Ventilation: Mask ventilation without difficulty and Oral airway inserted - appropriate to patient size Laryngoscope Size: Miller and 2 (1st attempt with miller 2- grade 4 view and no attempt at placing ETT. Grade 1 view with glidescope and easy placement.  ) Grade View: Grade I Tube type: Oral Tube size: 7.0 mm Number of attempts: 2 (see above.  ) Airway Equipment and Method: Stylet and Video-laryngoscopy Placement Confirmation: ETT inserted through vocal cords under direct vision,  positive ETCO2 and breath sounds checked- equal and bilateral Secured at: 21 cm Tube secured with: Tape Dental Injury: Teeth and Oropharynx as per pre-operative assessment

## 2014-03-15 NOTE — Brief Op Note (Signed)
Brief Operative note   Preoperative diagnosis:  pre retinal fibrosis left eye Postoperative diagnosis  Post-Op Diagnosis Codes:    * Macular puckering of retina, left [H35.372]  Procedures: Pars plana vitrectomy, laser, membrane peel, gas injection left eye  Surgeon:  Sherrie GeorgeJohn D Matthews, MD...  Assistant:   Charyl DancerMary Wood RN  Anesthesia: General  Specimen: none  Estimated blood loss:  1cc  Complications: none  Patient sent to PACU in good condition  Composed by Sherrie GeorgeJohn D Matthews MD  Dictation number: [id number]

## 2014-03-15 NOTE — Anesthesia Preprocedure Evaluation (Addendum)
Anesthesia Evaluation  Patient identified by MRN, date of birth, ID band Patient awake    Reviewed: Allergy & Precautions, H&P , NPO status , Patient's Chart, lab work & pertinent test results  History of Anesthesia Complications Negative for: history of anesthetic complications  Airway Mallampati: II TM Distance: <3 FB Neck ROM: Full    Dental  (+) Teeth Intact   Pulmonary pneumonia -, resolved,  PNA in March resolved breath sounds clear to auscultation        Cardiovascular negative cardio ROS  Rhythm:Regular     Neuro/Psych negative neurological ROS     GI/Hepatic negative GI ROS, Neg liver ROS,   Endo/Other  negative endocrine ROS  Renal/GU negative Renal ROS     Musculoskeletal  (+) Arthritis -,   Abdominal   Peds  Hematology negative hematology ROS (+)   Anesthesia Other Findings   Reproductive/Obstetrics                           Anesthesia Physical Anesthesia Plan  ASA: I  Anesthesia Plan: General   Post-op Pain Management:    Induction: Intravenous  Airway Management Planned: Oral ETT  Additional Equipment: None  Intra-op Plan:   Post-operative Plan: Extubation in OR  Informed Consent: I have reviewed the patients History and Physical, chart, labs and discussed the procedure including the risks, benefits and alternatives for the proposed anesthesia with the patient or authorized representative who has indicated his/her understanding and acceptance.   Dental advisory given  Plan Discussed with: CRNA and Surgeon  Anesthesia Plan Comments:         Anesthesia Quick Evaluation

## 2014-03-15 NOTE — H&P (Signed)
I examined the patient today and there is no change in the medical status 

## 2014-03-15 NOTE — Anesthesia Postprocedure Evaluation (Signed)
  Anesthesia Post-op Note  Patient: Emily Potter  Procedure(s) Performed: Procedure(s): PARS PLANA VITRECTOMY WITH 25 GAUGE (Left) LASER PHOTO ABLATION (Left) AIR/FLUID EXCHANGE (Left) MEMBRANE PEEL (Left)  Patient Location: PACU  Anesthesia Type:General  Level of Consciousness: awake  Airway and Oxygen Therapy: Patient Spontanous Breathing  Post-op Pain: mild  Post-op Assessment: Post-op Vital signs reviewed  Post-op Vital Signs: Reviewed  Last Vitals:  Filed Vitals:   03/15/14 1411  BP: 137/73  Pulse: 59  Temp: 36.6 C  Resp: 14    Complications: No apparent anesthesia complications 

## 2014-03-16 DIAGNOSIS — H35372 Puckering of macula, left eye: Secondary | ICD-10-CM | POA: Diagnosis not present

## 2014-03-16 MED ORDER — GATIFLOXACIN 0.5 % OP SOLN: 1.0000 [drp] | Freq: Four times a day (QID) | OPHTHALMIC | Status: AC

## 2014-03-16 MED ORDER — PREDNISOLONE ACETATE 1 % OP SUSP: 1.0000 [drp] | Freq: Four times a day (QID) | OPHTHALMIC | Status: AC

## 2014-03-16 MED ORDER — BACITRACIN-POLYMYXIN B 500-10000 UNIT/GM OP OINT: 1.0000 "application " | TOPICAL_OINTMENT | Freq: Four times a day (QID) | OPHTHALMIC | Status: AC

## 2014-03-16 NOTE — Op Note (Signed)
Emily Potter, Emily Potter NO.:  [id number]  MEDICAL RECORD NO.:  [id number]  LOCATION:  [id number]                        FACILITY:  [facility]  PHYSICIAN:  Beulah Gandy. Ashley Royalty, M.D. DATE OF BIRTH:  [birth year]  DATE OF PROCEDURE:  03/15/2014 DATE OF DISCHARGE:                              OPERATIVE REPORT   ADMISSION DIAGNOSES:  Preretinal fibrosis in the left eye, also age- related macular degeneration in the left eye.  PROCEDURES:  Pars plana vitrectomy, retinal photocoagulation, membrane peel, gas-fluid exchange in the left eye.  SURGEON:  Beulah Gandy. Ashley Royalty, M.D.  ASSISTANT:  Charyl Dancer, RN.  ANESTHESIA:  General.  DETAILS:  Usual prep and drape, the indirect ophthalmoscope laser was moved into place.  527 burns were placed around the retinal periphery in weak areas of retina.  The power was between 400 and 560 mW, 1000 microns each and 0.1 seconds each.  Attention was carried to the pars plana area where 25-gauge trocars were placed at 10, 2 and 4 o'clock. The infusion was placed at the 4 o'clock.  The contact lens ring was anchored into place at 6 and 12 o'clock.  Provisc was placed on the corneal surface and the flat contact lens was placed.  Pars plana vitrectomy was begun just behind the crystalline lens.  Portion of the posterior capsule was removed to gain access to better viewing.  The mid- vitreous was filled with white membranes.  The vitrectomy was carried down in a core fashion to the macular surface.  The posterior membrane was attached to the entire macula and the superior and inferior arcades. The membrane was also attached to the disc.  25-gauge forceps were used to grasp the membrane and freed it from its attachments to the macular region.  The cutter was used to release the edges of the membrane from peripheral attachments.  The membrane was eventually peeled all the way across the macular region and removed from the eye.  The vitrectomy was carried  into the midperiphery where stumps of membrane were trimmed down to the retinal surface.  The contact lens with ring was then removed and the contact lens was removed.  The BIOM viewing system was moved into place for wide viewing.  Additional vitrectomy was carried into the mid- periphery and the far periphery.  The vitrectomy was carried down to the vitreous base.  Additional vitreous membranes were attached down to the vitreous base.  These were trimmed short against the retina.  The endolaser was then positioned in the eye, 634 burns were placed around the retinal periphery and weak areas where the preretinal membrane was interfaced to the retina.  The power was 300 mW 1000 microns each and 0.1 seconds each.  The entire vitreous cavity was carefully inspected. Drusen, pigment and atrophy were seen consistent with macular degeneration in the macular region.  A 30% gas-fluid exchange was then carried out with sterile air.  The instruments were removed from the eye.  The wounds were tested and found to be secure.  The conjunctiva was allowed to slide over the scleral wounds, and no fluid or gas was released.  Polymyxin and gentamicin were irrigated into tenon  space. Marcaine was injected around the globe for postop pain.  Decadron 10 mg was injected into the lower subconjunctival space.  Polysporin ophthalmic ointment, a patch and shield were placed.  Closing pressure was 10 with a Barraquer tonometer.  COMPLICATIONS:  None.  DURATION:  1 hour.  The patient was awakened and taken to recovery in satisfactory condition.     Beulah GandyJohn D. Ashley RoyaltyMatthews, M.D.     JDM/MEDQ  D:  03/15/2014  T:  03/16/2014  Job:  [id number]

## 2014-03-16 NOTE — Progress Notes (Signed)
03/16/2014, 6:24 AM  Mental Status:  Awake, Alert, Oriented  Anterior segment: Cornea  Clear    Anterior Chamber Clear    Lens:    IOL  Intra Ocular Pressure 14 mmHg with Tonopen  Vitreous: Clear 30%gas bubble   Retina:  Attached Good laser reaction   Impression: Excellent result Retina attached   Final Diagnosis: Active Problems:   Preretinal fibrosis, left eye   Plan: start post operative eye drops.  Discharge to home.  Give post operative instructions  Sherrie GeorgeMATTHEWS, JOHN D 03/16/2014, 6:24 AM

## 2014-03-16 NOTE — Discharge Summary (Signed)
Discharge summary not needed on [facility] patients per medical records. 

## 2014-03-17 ENCOUNTER — Encounter (HOSPITAL_COMMUNITY): Payer: Self-pay | Admitting: Ophthalmology

## 2014-03-22 ENCOUNTER — Encounter (INDEPENDENT_AMBULATORY_CARE_PROVIDER_SITE_OTHER): Payer: Medicare Other | Admitting: Ophthalmology

## 2014-03-22 DIAGNOSIS — H35372 Puckering of macula, left eye: Secondary | ICD-10-CM

## 2014-04-13 ENCOUNTER — Encounter (INDEPENDENT_AMBULATORY_CARE_PROVIDER_SITE_OTHER): Payer: Medicare Other | Admitting: Ophthalmology

## 2014-04-13 DIAGNOSIS — H35372 Puckering of macula, left eye: Secondary | ICD-10-CM

## 2014-06-22 ENCOUNTER — Encounter (INDEPENDENT_AMBULATORY_CARE_PROVIDER_SITE_OTHER): Payer: Medicare Other | Admitting: Ophthalmology

## 2014-06-22 DIAGNOSIS — H3531 Nonexudative age-related macular degeneration: Secondary | ICD-10-CM

## 2014-06-22 DIAGNOSIS — H35373 Puckering of macula, bilateral: Secondary | ICD-10-CM

## 2014-12-22 ENCOUNTER — Ambulatory Visit (INDEPENDENT_AMBULATORY_CARE_PROVIDER_SITE_OTHER): Payer: Medicare Other | Admitting: Ophthalmology

## 2014-12-29 ENCOUNTER — Ambulatory Visit (INDEPENDENT_AMBULATORY_CARE_PROVIDER_SITE_OTHER): Payer: Medicare Other | Admitting: Ophthalmology

## 2014-12-29 DIAGNOSIS — H35372 Puckering of macula, left eye: Secondary | ICD-10-CM

## 2014-12-29 DIAGNOSIS — H3531 Nonexudative age-related macular degeneration: Secondary | ICD-10-CM

## 2015-03-14 IMAGING — CR DG CHEST 2V
2 series · 2 of 2 positions shown · non-contrast
Comparison: Chest x-ray 07/05/2004.

CLINICAL DATA: Nonproductive cough.  Fever.

EXAM:
CHEST  2 VIEW

[view not recorded (1 of 2)]
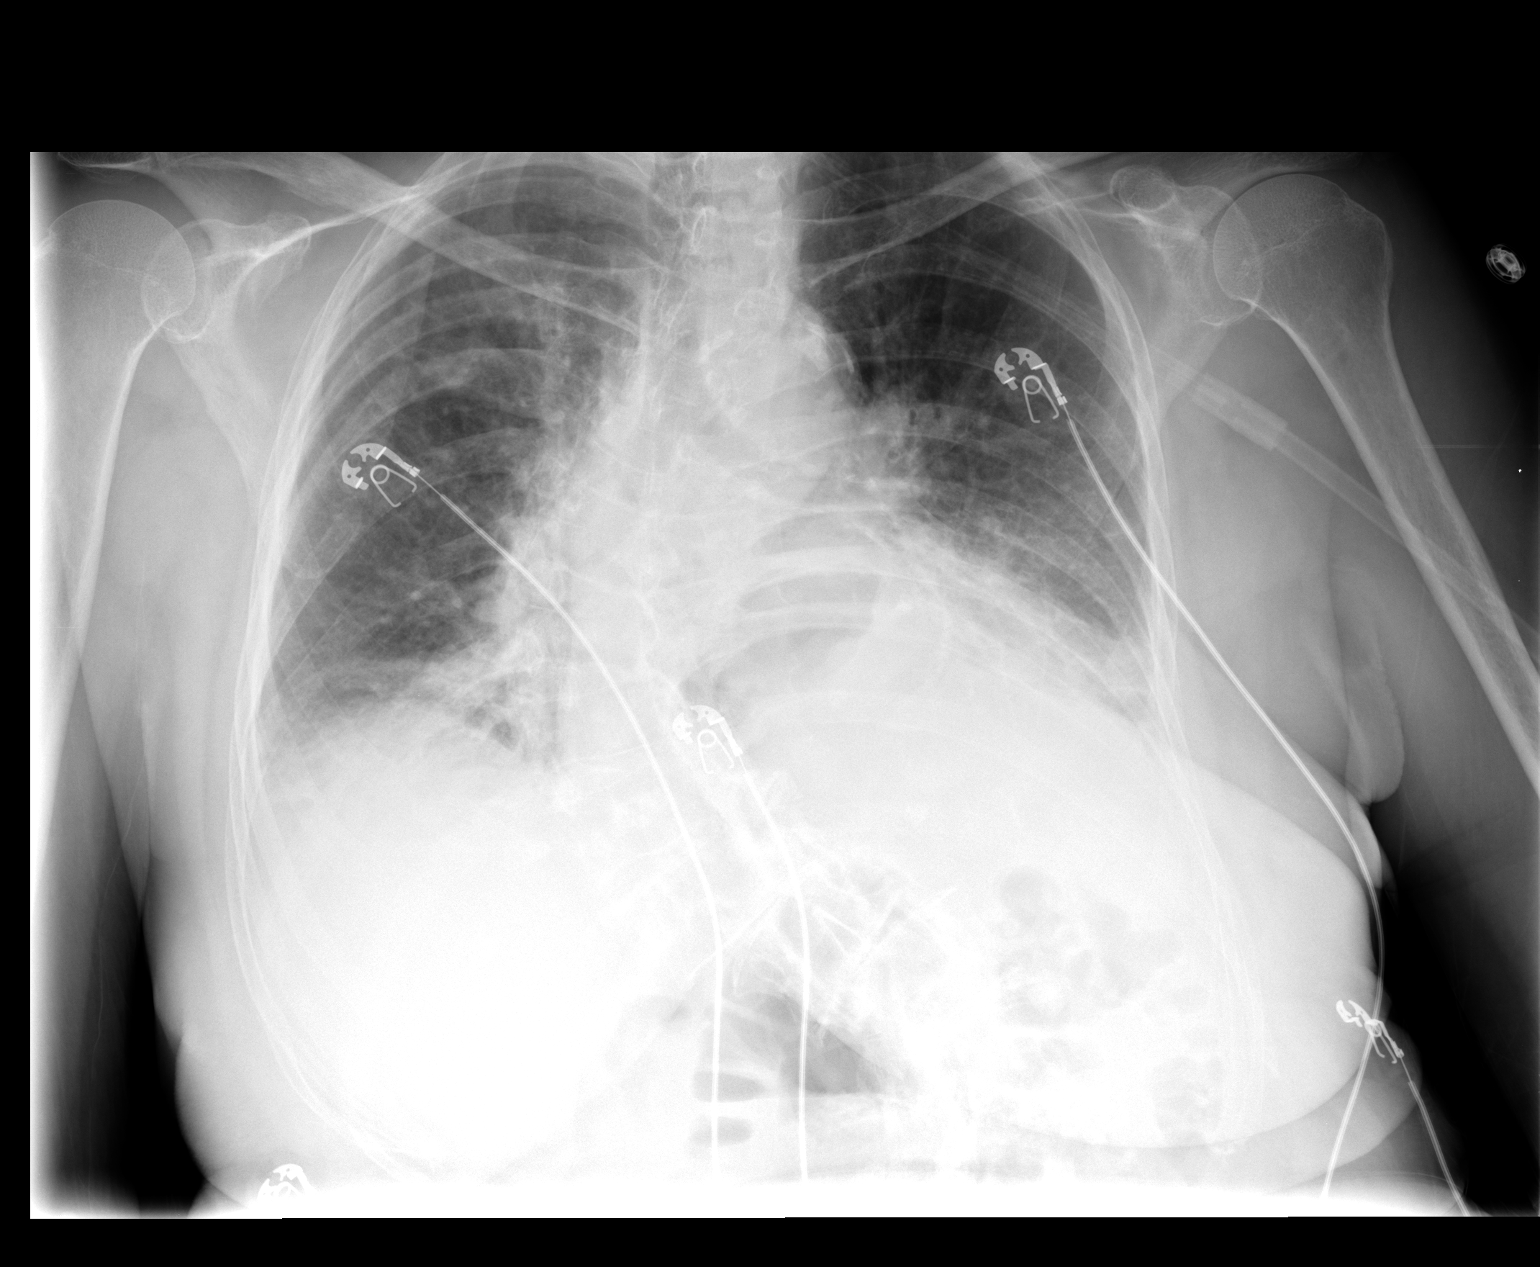

[view not recorded (2 of 2)]
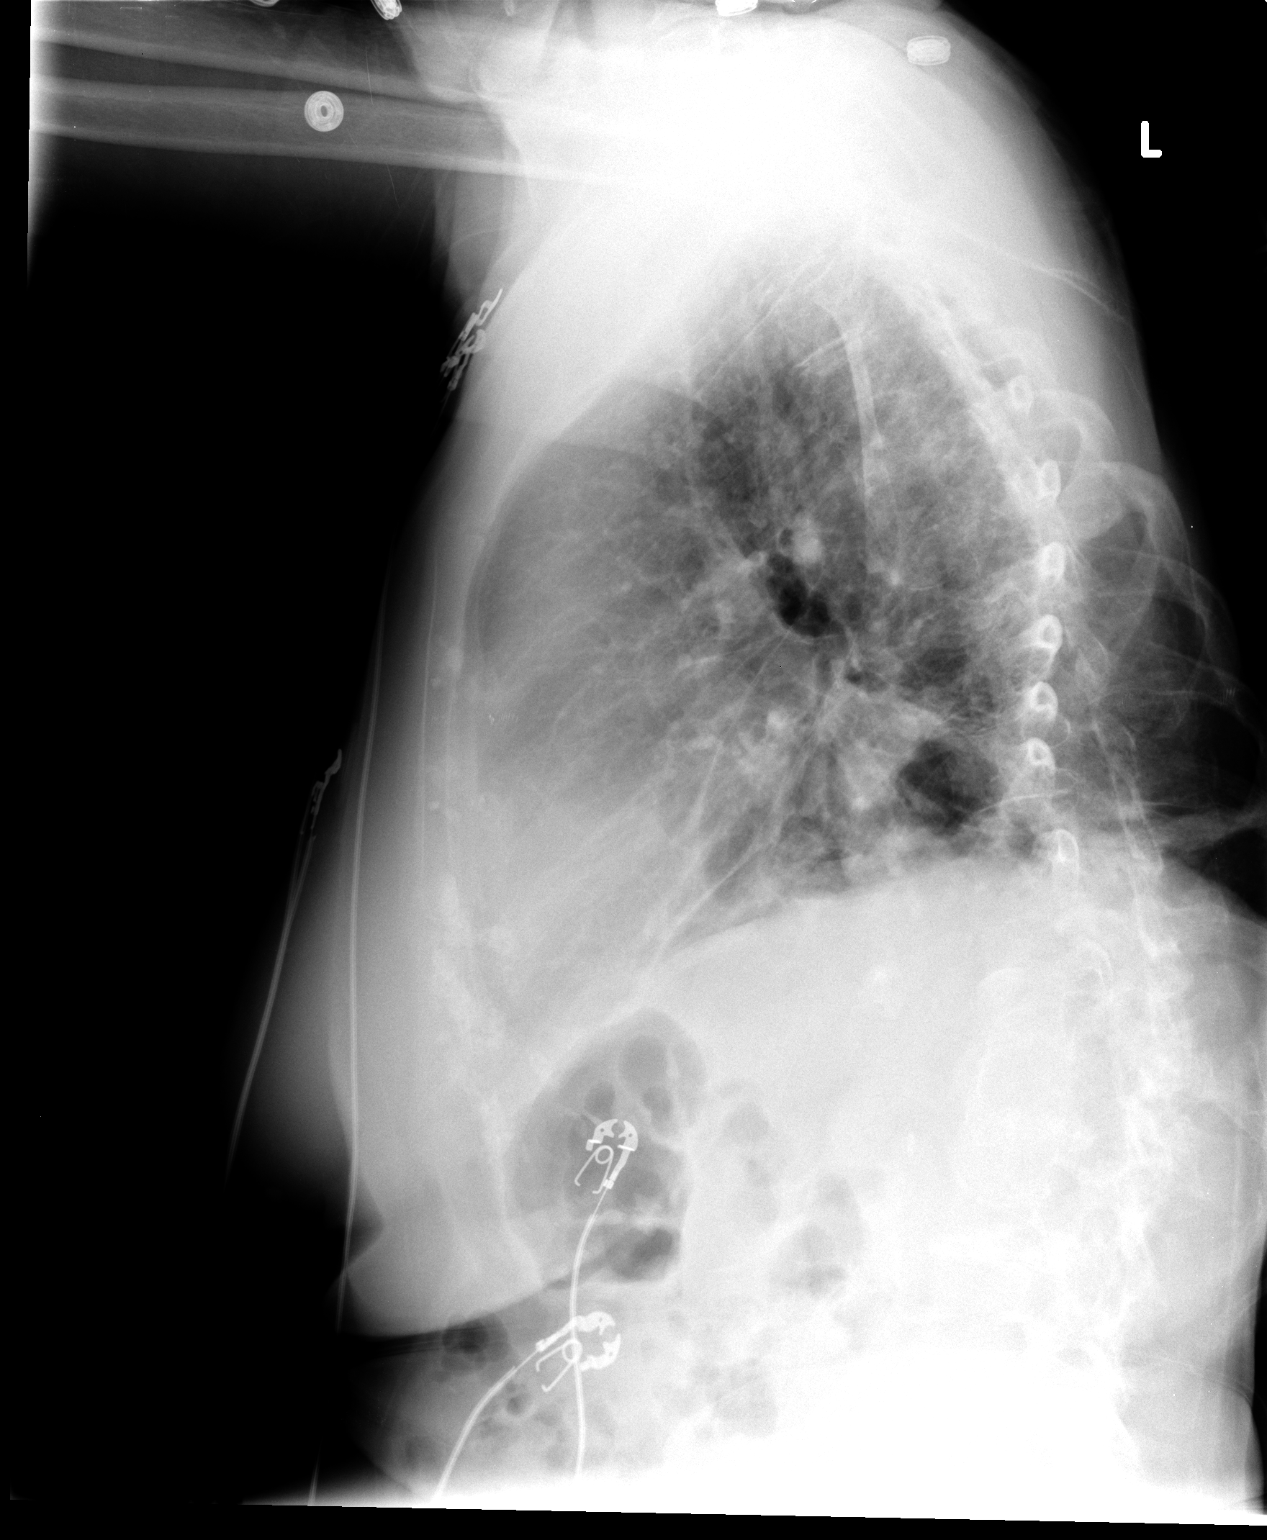

[2 of 2 positions shown; findings below may reference images not displayed]

FINDINGS: Lung volumes are low and there are bibasilar opacities which may
reflect areas of atelectasis and/or consolidation. Probable trace
bilateral pleural effusions. No evidence of pulmonary edema. Heart
size appears upper limits of normal. Mediastinal contours are
grossly distorted by patient positioning. Severe S-shaped
thoracolumbar scoliosis, convex to the right in the lower thoracic
region and to the left in the lumbar region. Atherosclerosis in the
thoracic aorta.
IMPRESSION: 1. Low lung volumes with bibasilar areas of atelectasis and/or
consolidation and small bilateral pleural effusions.
2. Atherosclerosis.

## 2015-07-05 ENCOUNTER — Ambulatory Visit (INDEPENDENT_AMBULATORY_CARE_PROVIDER_SITE_OTHER): Payer: Medicare Other | Admitting: Ophthalmology

## 2015-07-31 ENCOUNTER — Ambulatory Visit (INDEPENDENT_AMBULATORY_CARE_PROVIDER_SITE_OTHER): Payer: Medicare Other | Admitting: Ophthalmology

## 2015-07-31 DIAGNOSIS — H35373 Puckering of macula, bilateral: Secondary | ICD-10-CM | POA: Diagnosis not present

## 2015-07-31 DIAGNOSIS — H353134 Nonexudative age-related macular degeneration, bilateral, advanced atrophic with subfoveal involvement: Secondary | ICD-10-CM | POA: Diagnosis not present

## 2015-10-04 IMAGING — CR DG CHEST 2V
2 series · 2 of 2 positions shown · non-contrast
Comparison: 08/23/2013

CLINICAL DATA: Pneumonia.  Short of breath.

EXAM:
CHEST  2 VIEW

[w chest pa]
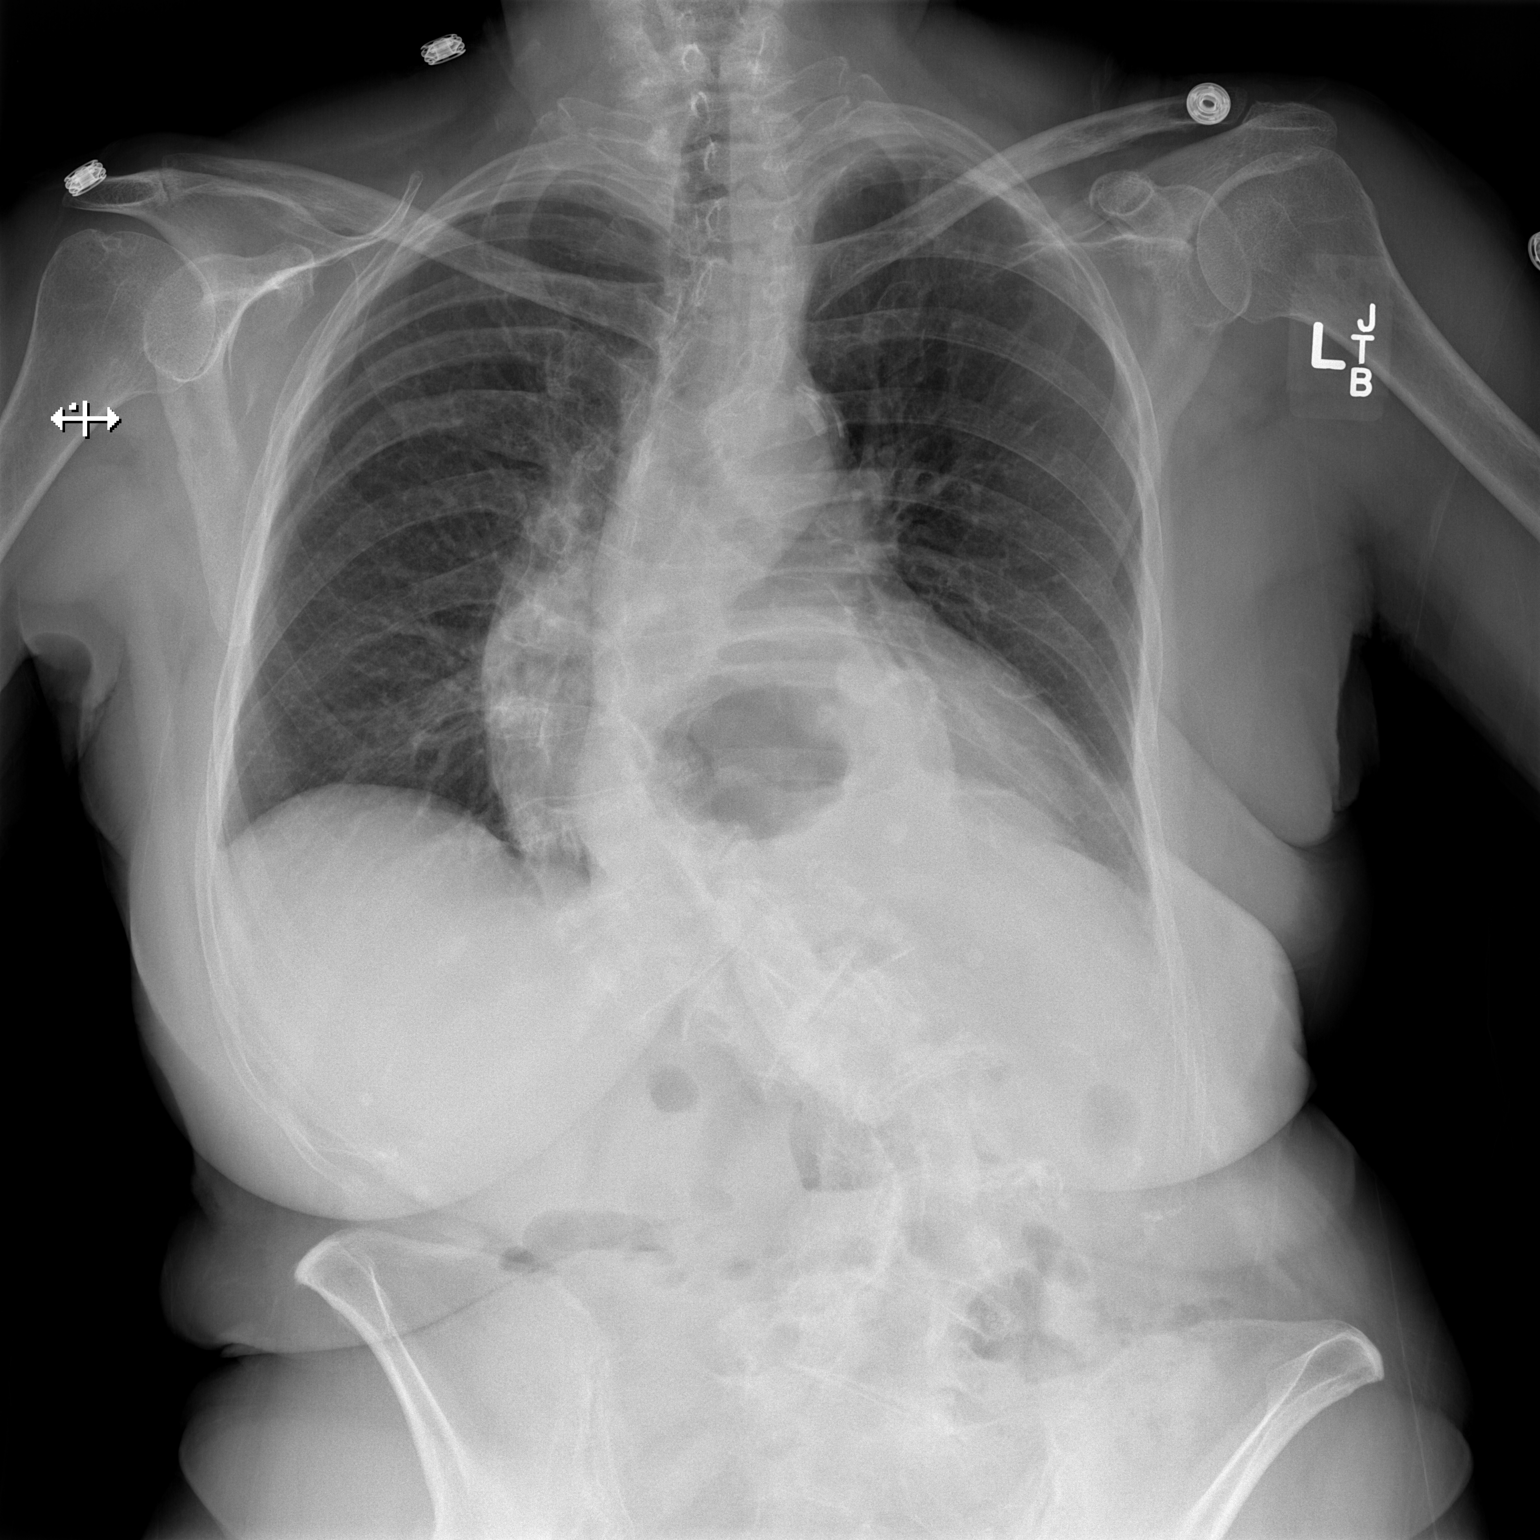

[w chest lat]
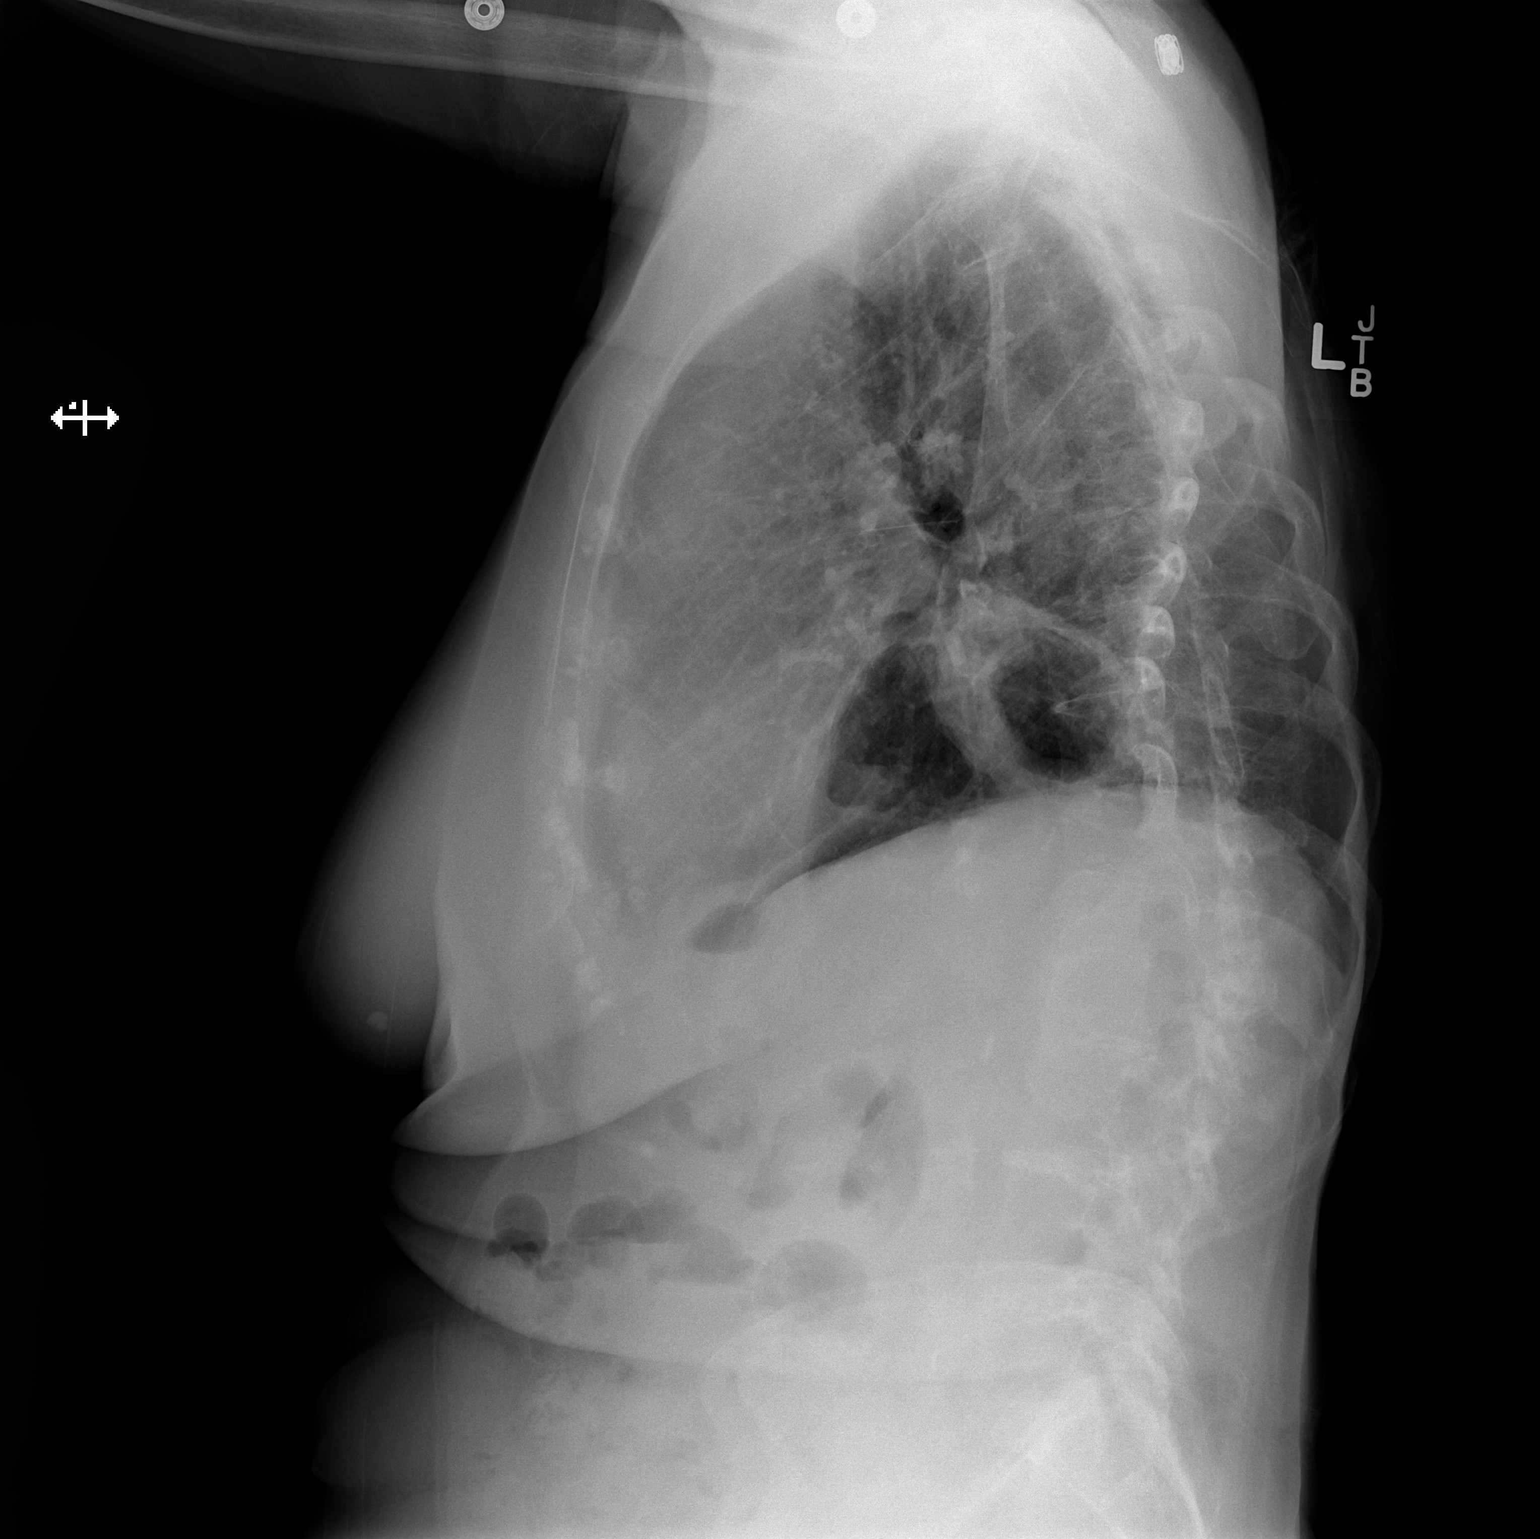

[2 of 2 positions shown; findings below may reference images not displayed]

FINDINGS: Moderate cardiomegaly. Large hiatal hernia. Volume loss at the left
base. Severe dextroscoliosis in the mid thoracic spine. No
pneumothorax. No pleural effusion.
IMPRESSION: Chronic changes.  No active disease.

## 2016-01-25 ENCOUNTER — Ambulatory Visit (INDEPENDENT_AMBULATORY_CARE_PROVIDER_SITE_OTHER): Payer: Medicare Other | Admitting: Ophthalmology

## 2016-01-25 DIAGNOSIS — H353132 Nonexudative age-related macular degeneration, bilateral, intermediate dry stage: Secondary | ICD-10-CM

## 2016-01-25 DIAGNOSIS — H35372 Puckering of macula, left eye: Secondary | ICD-10-CM | POA: Diagnosis not present

## 2016-01-25 DIAGNOSIS — H43811 Vitreous degeneration, right eye: Secondary | ICD-10-CM | POA: Diagnosis not present

## 2016-01-29 ENCOUNTER — Ambulatory Visit (INDEPENDENT_AMBULATORY_CARE_PROVIDER_SITE_OTHER): Payer: Medicare Other | Admitting: Ophthalmology

## 2016-07-29 ENCOUNTER — Ambulatory Visit (INDEPENDENT_AMBULATORY_CARE_PROVIDER_SITE_OTHER): Payer: Medicare Other | Admitting: Ophthalmology

## 2016-07-29 DIAGNOSIS — H353132 Nonexudative age-related macular degeneration, bilateral, intermediate dry stage: Secondary | ICD-10-CM

## 2016-07-29 DIAGNOSIS — H35371 Puckering of macula, right eye: Secondary | ICD-10-CM

## 2017-07-29 ENCOUNTER — Ambulatory Visit (INDEPENDENT_AMBULATORY_CARE_PROVIDER_SITE_OTHER): Payer: Medicare Other | Admitting: Ophthalmology

## 2017-08-26 ENCOUNTER — Encounter (INDEPENDENT_AMBULATORY_CARE_PROVIDER_SITE_OTHER): Payer: Medicare Other | Admitting: Ophthalmology

## 2017-08-26 DIAGNOSIS — H353132 Nonexudative age-related macular degeneration, bilateral, intermediate dry stage: Secondary | ICD-10-CM | POA: Diagnosis not present

## 2017-08-26 DIAGNOSIS — H35371 Puckering of macula, right eye: Secondary | ICD-10-CM

## 2018-08-31 ENCOUNTER — Encounter (INDEPENDENT_AMBULATORY_CARE_PROVIDER_SITE_OTHER): Payer: Medicare Other | Admitting: Ophthalmology

## 2018-10-05 ENCOUNTER — Encounter (INDEPENDENT_AMBULATORY_CARE_PROVIDER_SITE_OTHER): Payer: Medicare Other | Admitting: Ophthalmology

## 2018-12-03 ENCOUNTER — Encounter (INDEPENDENT_AMBULATORY_CARE_PROVIDER_SITE_OTHER): Payer: Medicare Other | Admitting: Ophthalmology

## 2018-12-21 ENCOUNTER — Encounter (INDEPENDENT_AMBULATORY_CARE_PROVIDER_SITE_OTHER): Payer: Medicare Other | Admitting: Ophthalmology

## 2018-12-21 ENCOUNTER — Other Ambulatory Visit: Payer: Self-pay

## 2018-12-21 DIAGNOSIS — H35372 Puckering of macula, left eye: Secondary | ICD-10-CM | POA: Diagnosis not present

## 2018-12-21 DIAGNOSIS — H353132 Nonexudative age-related macular degeneration, bilateral, intermediate dry stage: Secondary | ICD-10-CM

## 2019-12-22 ENCOUNTER — Other Ambulatory Visit: Payer: Self-pay

## 2019-12-22 ENCOUNTER — Encounter (INDEPENDENT_AMBULATORY_CARE_PROVIDER_SITE_OTHER): Payer: Medicare Other | Admitting: Ophthalmology

## 2019-12-22 DIAGNOSIS — H353132 Nonexudative age-related macular degeneration, bilateral, intermediate dry stage: Secondary | ICD-10-CM

## 2019-12-22 DIAGNOSIS — H35371 Puckering of macula, right eye: Secondary | ICD-10-CM

## 2020-12-21 ENCOUNTER — Encounter (INDEPENDENT_AMBULATORY_CARE_PROVIDER_SITE_OTHER): Payer: Medicare Other | Admitting: Ophthalmology

## 2022-03-23 ENCOUNTER — Encounter (HOSPITAL_COMMUNITY): Payer: Self-pay | Admitting: *Deleted

## 2022-03-23 ENCOUNTER — Emergency Department (HOSPITAL_COMMUNITY)
Admission: EM | Admit: 2022-03-23 | Discharge: 2022-03-23 | Disposition: A | Discharge status: Home / Self Care | Payer: Medicare Other | Attending: Emergency Medicine | Admitting: Emergency Medicine

## 2022-03-23 ENCOUNTER — Emergency Department (HOSPITAL_COMMUNITY): Payer: Medicare Other

## 2022-03-23 ENCOUNTER — Other Ambulatory Visit: Payer: Self-pay

## 2022-03-23 DIAGNOSIS — U071 COVID-19: Secondary | ICD-10-CM | POA: Diagnosis not present

## 2022-03-23 DIAGNOSIS — J209 Acute bronchitis, unspecified: Secondary | ICD-10-CM | POA: Insufficient documentation

## 2022-03-23 DIAGNOSIS — Z20822 Contact with and (suspected) exposure to covid-19: Secondary | ICD-10-CM

## 2022-03-23 DIAGNOSIS — R0602 Shortness of breath: Secondary | ICD-10-CM | POA: Insufficient documentation

## 2022-03-23 DIAGNOSIS — R059 Cough, unspecified: Secondary | ICD-10-CM | POA: Diagnosis present

## 2022-03-23 LAB — BASIC METABOLIC PANEL
Anion gap: 7 (ref 5–15)
BUN: 17 mg/dL (ref 8–23)
CO2: 31 mmol/L (ref 22–32)
Calcium: 8.7 mg/dL — ABNORMAL LOW (ref 8.9–10.3)
Chloride: 96 mmol/L — ABNORMAL LOW (ref 98–111)
Creatinine, Ser: 0.81 mg/dL (ref 0.44–1.00)
GFR, Estimated: >60 mL/min (ref >60)
Glucose, Bld: 111 mg/dL — ABNORMAL HIGH (ref 70–99)
Potassium: 3.9 mmol/L (ref 3.5–5.1)
Sodium: 134 mmol/L — ABNORMAL LOW (ref 135–145)

## 2022-03-23 LAB — CBC WITH DIFFERENTIAL/PLATELET
Abs Immature Granulocytes: 0.05 10*3/uL (ref 0.00–0.07)
Basophils Absolute: 0 10*3/uL (ref 0.0–0.1)
Basophils Relative: 1 %
Eosinophils Absolute: 0.1 10*3/uL (ref 0.0–0.5)
Eosinophils Relative: 1 %
HCT: 28.4 % — ABNORMAL LOW (ref 36.0–46.0)
Hemoglobin: 9.2 g/dL — ABNORMAL LOW (ref 12.0–15.0)
Immature Granulocytes: 1 %
Lymphocytes Relative: 13 %
Lymphs Abs: 0.9 10*3/uL (ref 0.7–4.0)
MCH: 32.2 pg (ref 26.0–34.0)
MCHC: 32.4 g/dL (ref 30.0–36.0)
MCV: 99.3 fL (ref 80.0–100.0)
Monocytes Absolute: 0.8 10*3/uL (ref 0.1–1.0)
Monocytes Relative: 12 %
Neutro Abs: 4.9 10*3/uL (ref 1.7–7.7)
Neutrophils Relative %: 72 %
Platelets: 253 10*3/uL (ref 150–400)
RBC: 2.86 MIL/uL — ABNORMAL LOW (ref 3.87–5.11)
RDW: 13.5 % (ref 11.5–15.5)
WBC: 6.6 10*3/uL (ref 4.0–10.5)
nRBC: 0 % (ref 0.0–0.2)

## 2022-03-23 LAB — RESP PANEL BY RT-PCR (FLU A&B, COVID) ARPGX2
Influenza A by PCR: NEGATIVE
Influenza B by PCR: NEGATIVE
SARS Coronavirus 2 by RT PCR: POSITIVE — AB

## 2022-03-23 LAB — BRAIN NATRIURETIC PEPTIDE: B Natriuretic Peptide: 72 pg/mL (ref 0.0–100.0)

## 2022-03-23 MED ORDER — ALBUTEROL SULFATE HFA 108 (90 BASE) MCG/ACT IN AERS
2.0000 | INHALATION_SPRAY | Freq: Once | RESPIRATORY_TRACT | Status: AC
  Administered 2022-03-23: 2 via RESPIRATORY_TRACT
  Filled 2022-03-23: qty 6.7

## 2022-03-23 MED ORDER — AZITHROMYCIN 250 MG PO TABS: 250.0000 mg | ORAL_TABLET | Freq: Every day | ORAL | 0 refills | Status: AC

## 2022-03-23 MED ORDER — ALBUTEROL SULFATE (2.5 MG/3ML) 0.083% IN NEBU
2.5000 mg | INHALATION_SOLUTION | Freq: Once | RESPIRATORY_TRACT | Status: AC
  Administered 2022-03-23: 2.5 mg via RESPIRATORY_TRACT
  Filled 2022-03-23: qty 3

## 2022-03-23 MED ORDER — AZITHROMYCIN 250 MG PO TABS
500.0000 mg | ORAL_TABLET | Freq: Once | ORAL | Status: AC
  Administered 2022-03-23: 500 mg via ORAL
  Filled 2022-03-23: qty 2

## 2022-03-23 NOTE — ED Notes (Signed)
RT at Pt bedside

## 2022-03-23 NOTE — ED Notes (Signed)
ED Provider at bedside. 

## 2022-03-23 NOTE — Discharge Instructions (Signed)
You are seen in the emergency department for an ongoing cough.  Your chest x-ray showed a possible pneumonia.  You were given antibiotics and breathing treatments.  Your blood work showed you to be anemic and this will need to be followed up with your primary care doctor.  You can use the albuterol inhaler 2 puffs every 6 hours as needed.  Drink plenty of fluids.  Return to the emergency department if any worsening or concerning symptoms.  Your COVID and flu test were pending at time of discharge and you can follow this up in [place].

## 2022-03-23 NOTE — ED Triage Notes (Signed)
Pt with cough for about 12 days per son, states has gotten worse. Denies any fevers. Denies any pain. Caregiver was out sick recently.

## 2022-03-23 NOTE — ED Provider Notes (Signed)
[facility] EMERGENCY DEPARTMENT Provider Note   CSN: [id number] Arrival date & time: 03/23/22  1834     History {Add pertinent medical, surgical, social history, OB history to HPI:1} Chief Complaint  Patient presents with   Cough    CLEONA DOUBLEDAY is a 88 y.o. female.  She is brought in by her son for evaluation of a cough that is been going on almost 2 weeks.  Nonproductive.  No known fevers.  He said her appetite has been a little bit down.  She is a non-smoker.  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  No reported diarrhea or vomiting.  No sick contacts or recent travel.  The history is provided by the patient and a relative.  Cough Cough characteristics:  Non-productive Sputum characteristics:  Nondescript Severity:  Moderate Onset quality:  Gradual Duration:  12 days Timing:  Intermittent Progression:  Worsening Chronicity:  New Smoker: no   Context: not sick contacts   Relieved by:  Nothing Worsened by:  Activity Ineffective treatments:  None tried Associated symptoms: no chest pain, no chills, no fever, no rhinorrhea, no shortness of breath and no sore throat   Risk factors: no recent infection and no recent travel        Home Medications Prior to Admission medications   Medication Sig Start Date End Date Taking? Authorizing Provider  bacitracin-polymyxin b (POLYSPORIN) ophthalmic ointment Place 1 application into the left eye 4 (four) times daily. apply to eye every 12 hours while awake 03/16/14   [name], MD  Bromfenac Sodium (BROMDAY) 0.09 % SOLN Place 1 drop into the right eye at bedtime.    [provider]  gatifloxacin (ZYMAXID) 0.5 % SOLN Place 1 drop into the left eye 4 (four) times daily. 03/16/14   [name], MD  Multiple Vitamins-Minerals (PRESERVISION AREDS) TABS Take 1 tablet by mouth 2 (two) times daily.    [provider]  prednisoLONE acetate (PRED FORTE) 1 % ophthalmic suspension Place 1 drop into the  left eye 4 (four) times daily. 03/16/14   [name], MD      Allergies    Patient has no known allergies.    Review of Systems   Review of Systems  Constitutional:  Negative for chills and fever.  HENT:  Negative for rhinorrhea and sore throat.   Respiratory:  Positive for cough. Negative for shortness of breath.   Cardiovascular:  Negative for chest pain.  Gastrointestinal:  Negative for nausea and vomiting.    Physical Exam Updated Vital Signs BP (!) 150/69   Pulse 68   Temp 97.6 F (36.4 C) (Oral)   Resp 20   Ht 4\' 10"  (1.473 m)   Wt 54 kg   SpO2 96%   BMI 24.88 kg/m  Physical Exam Vitals and nursing note reviewed.  Constitutional:      General: She is not in acute distress.    Appearance: Normal appearance. She is well-developed.  HENT:     Head: Normocephalic and atraumatic.  Eyes:     Conjunctiva/sclera: Conjunctivae normal.  Cardiovascular:     Rate and Rhythm: Normal rate and regular rhythm.     Heart sounds: No murmur heard. Pulmonary:     Effort: Pulmonary effort is normal. No respiratory distress.     Breath sounds: Rhonchi (few scattered) present.  Abdominal:     Palpations: Abdomen is soft.     Tenderness: There is no abdominal tenderness.  Musculoskeletal:        General: Normal range of motion.     Cervical back: Neck supple.     Right lower leg: No edema.     Left lower leg: No edema.  Skin:    General: Skin is warm and dry.     Capillary Refill: Capillary refill takes less than 2 seconds.  Neurological:     General: No focal deficit present.     Mental Status: She is alert.     ED Results / Procedures / Treatments   Labs (all labs ordered are listed, but only abnormal results are displayed) Labs Reviewed  RESP PANEL BY RT-PCR (FLU A&B, COVID) ARPGX2  BRAIN NATRIURETIC PEPTIDE  CBC WITH DIFFERENTIAL/PLATELET  BASIC METABOLIC PANEL    EKG None  Radiology No results found.  Procedures Procedures  {Document cardiac  monitor, telemetry assessment procedure when appropriate:1}  Medications Ordered in ED Medications - No data to display  ED Course/ Medical Decision Making/ A&P                           Medical Decision Making Amount and/or Complexity of Data Reviewed Labs: ordered. Radiology: ordered.  KYLIEGH JESTER was evaluated in Emergency Department on 03/23/2022 for the symptoms described in the history of present illness. She was evaluated in the context of the global COVID-19 pandemic, which necessitated consideration that the patient might be at risk for infection with the SARS-CoV-2 virus that causes COVID-19. Institutional protocols and algorithms that pertain to the evaluation of patients at risk for COVID-19 are in a state of rapid change based on information released by regulatory bodies including the CDC and federal and state organizations. These policies and algorithms were followed during the patient's care in the ED.  This patient complains of ***; this involves an extensive number of treatment Options and is a complaint that carries with it a high risk of complications and morbidity. The differential includes ***  I ordered, reviewed and interpreted labs, which included *** I ordered medication *** and reviewed PMP when indicated. I ordered imaging studies which included *** and I independently    visualized and interpreted imaging which showed *** Additional history obtained from *** Previous records obtained and reviewed *** I consulted *** and discussed lab and imaging findings and discussed disposition.  Cardiac monitoring reviewed, *** Social determinants considered, *** Critical Interventions: ***  After the interventions stated above, I reevaluated the patient and found *** Admission and further testing considered, ***   {Document critical care time when appropriate:1} {Document review of labs and clinical decision tools ie heart score, Chads2Vasc2 etc:1}  {Document your  independent review of radiology images, and any outside records:1} {Document your discussion with family members, caretakers, and with consultants:1} {Document social determinants of health affecting pt's care:1} {Document your decision making why or why not admission, treatments were needed:1} Final Clinical Impression(s) / ED Diagnoses Final diagnoses:  None    Rx / DC Orders ED Discharge Orders     None

## 2022-03-25 NOTE — ED Notes (Addendum)
Pt's son called Monday, October 16 at 21 and stated that someone from hospital called but did not leave a message as to why they were calling;  informed son I could not find a note as to why he was called but did inform him pt is positive for Covid and told him if pt became more sob or was unable to drink fluids to come in to be re-evaluated.  Son verbalized understanding
